# Patient Record
Sex: Female | Born: 1935
Health system: Southern US, Community
[De-identification: ages and names within clinical notes are randomized; demographics above are authoritative.]

## PROBLEM LIST (undated history)

## (undated) DIAGNOSIS — E039 Hypothyroidism, unspecified: Secondary | ICD-10-CM

## (undated) DIAGNOSIS — C801 Malignant (primary) neoplasm, unspecified: Secondary | ICD-10-CM

## (undated) DIAGNOSIS — K224 Dyskinesia of esophagus: Secondary | ICD-10-CM

## (undated) DIAGNOSIS — K921 Melena: Secondary | ICD-10-CM

## (undated) DIAGNOSIS — H539 Unspecified visual disturbance: Secondary | ICD-10-CM

## (undated) DIAGNOSIS — I10 Essential (primary) hypertension: Secondary | ICD-10-CM

## (undated) DIAGNOSIS — S52502A Unspecified fracture of the lower end of left radius, initial encounter for closed fracture: Secondary | ICD-10-CM

## (undated) DIAGNOSIS — D649 Anemia, unspecified: Secondary | ICD-10-CM

## (undated) DIAGNOSIS — I639 Cerebral infarction, unspecified: Secondary | ICD-10-CM

## (undated) DIAGNOSIS — K219 Gastro-esophageal reflux disease without esophagitis: Secondary | ICD-10-CM

## (undated) DIAGNOSIS — F419 Anxiety disorder, unspecified: Secondary | ICD-10-CM

## (undated) HISTORY — DX: Unspecified visual disturbance: H53.9

## (undated) HISTORY — DX: Cerebral infarction, unspecified: I63.9

## (undated) HISTORY — PX: OTHER SURGICAL HISTORY: SHX169

## (undated) SURGERY — EGD (ESOPHAGOGASTRODUODENOSCOPY)
Anesthesia: Moderate Sedation

---

## 2004-10-11 ENCOUNTER — Encounter: Payer: Self-pay | Admitting: Internal Medicine

## 2006-09-03 ENCOUNTER — Ambulatory Visit: Payer: Self-pay | Admitting: Internal Medicine

## 2006-09-17 ENCOUNTER — Ambulatory Visit: Payer: Self-pay | Admitting: Internal Medicine

## 2009-01-15 DIAGNOSIS — E119 Type 2 diabetes mellitus without complications: Secondary | ICD-10-CM

## 2009-01-15 DIAGNOSIS — R1310 Dysphagia, unspecified: Secondary | ICD-10-CM | POA: Insufficient documentation

## 2009-01-15 DIAGNOSIS — E039 Hypothyroidism, unspecified: Secondary | ICD-10-CM | POA: Insufficient documentation

## 2009-01-15 DIAGNOSIS — K5730 Diverticulosis of large intestine without perforation or abscess without bleeding: Secondary | ICD-10-CM | POA: Insufficient documentation

## 2009-01-15 DIAGNOSIS — F329 Major depressive disorder, single episode, unspecified: Secondary | ICD-10-CM

## 2009-01-15 DIAGNOSIS — E7800 Pure hypercholesterolemia, unspecified: Secondary | ICD-10-CM

## 2009-01-18 ENCOUNTER — Ambulatory Visit (HOSPITAL_COMMUNITY): Admission: RE | Admit: 2009-01-18 | Discharge: 2009-01-18 | Payer: Self-pay | Admitting: Ophthalmology

## 2009-01-19 ENCOUNTER — Ambulatory Visit: Payer: Self-pay | Admitting: Internal Medicine

## 2009-01-26 ENCOUNTER — Ambulatory Visit (HOSPITAL_COMMUNITY): Admission: RE | Admit: 2009-01-26 | Discharge: 2009-01-26 | Payer: Self-pay | Admitting: Internal Medicine

## 2009-01-29 ENCOUNTER — Encounter: Payer: Self-pay | Admitting: Internal Medicine

## 2009-02-12 ENCOUNTER — Ambulatory Visit: Payer: Self-pay | Admitting: Internal Medicine

## 2009-04-16 ENCOUNTER — Ambulatory Visit: Payer: Self-pay | Admitting: Internal Medicine

## 2010-04-23 NOTE — Assessment & Plan Note (Signed)
Summary: f/u...as.    History of Present Illness Visit Type: Follow-up Visit Primary GI MD: Lina Sar MD Primary Provider: Doreen Beam, MD Requesting Provider: n/a Chief Complaint: Patient denies any GI complaints at this time since she is taking her PPI once a day. She is only using the Levsin as needed.   History of Present Illness:   This is a 77 year old white female with esophageal dysmotility shown on a barium esophagram which was done in November 2010.The barium swallow showed moderate esophageal dysmotility with breakup of primary waves and intermittent tertiary contractions. She has a small hiatal hernia and mild gastroesophageal reflux. She started on a proton pump inhibitor, one a day and her symptoms have improved. She had a colonoscopy in 2008 with findings of diverticulosis. she denies having any dysphagia since her last appointment in November. Patient has continued on her Prilosec 20 mg daily. Her last episode of dysphagia occurrs when she tried to drink ice cold liquids.   GI Review of Systems      Denies abdominal pain, acid reflux, belching, bloating, chest pain, dysphagia with liquids, dysphagia with solids, heartburn, loss of appetite, nausea, vomiting, vomiting blood, weight loss, and  weight gain.        Denies anal fissure, black tarry stools, change in bowel habit, constipation, diarrhea, diverticulosis, fecal incontinence, heme positive stool, hemorrhoids, irritable bowel syndrome, jaundice, light color stool, liver problems, rectal bleeding, and  rectal pain.    Current Medications (verified): 1)  Zoloft 100 Mg Tabs (Sertraline Hcl) .... One Tablet By Mouth Once Daily 2)  Red Yeast Rice 600 Mg Tabs (Red Yeast Rice Extract) .... One Tablet By Mouth Once Daily 3)  Vitamin D (Ergocalciferol) 50000 Unit Caps (Ergocalciferol) .... Take One By Mouth Once A Week 4)  Fish Oil 1000 Mg Caps (Omega-3 Fatty Acids) .Marland Kitchen.. 1 Tablets By Mouth Once Daily 5)  Synthroid 25 Mcg Tabs  (Levothyroxine Sodium) .... One Tablet By Mouth Once Daily 6)  Cyanocobalamin 1000 Mcg/ml Soln (Cyanocobalamin) .... One Ml Once A Month 7)  Levsin/sl 0.125 Mg Subl (Hyoscyamine Sulfate) .... Dissolve 1 Tablet Under The Tongue As Needed For Esophageal Spasms 8)  Icaps  Caps (Multiple Vitamins-Minerals) .... Take One By Mouth Once Daily 9)  Omeprazole 20 Mg Cpdr (Omeprazole) .... Take One By Mouth Once Daily  Allergies (verified): 1)  ! Pcn 2)  ! Sulfa  Past History:  Past Medical History: Last updated: 01/15/2009 Current Problems:  DYSPHAGIA UNSPECIFIED (ICD-787.20) DIABETES MELLITUS, TYPE II (ICD-250.00) DEPRESSION (ICD-311) HYPOTHYROIDISM (ICD-244.9) DIVERTICULOSIS, COLON (ICD-562.10) HYPERCHOLESTEROLEMIA (ICD-272.0)    Past Surgical History: Arthroscopic surgery left eye surgery  Family History: Reviewed history from 01/19/2009 and no changes required. Family History of Diabetes: Mother Family History of Heart Disease: Mother No FH of Colon Cancer Family History of Uterine Cancer:Mother Family History of Colon Polyps:Sister  Social History: Reviewed history from 01/19/2009 and no changes required. Occupation: Retired from [employer] Alcohol Use - no Illicit Drug Use - no Patient has never smoked.   Review of Systems       The patient complains of change in vision, thirst - excessive, and urination changes/pain.  The patient denies allergy/sinus, anemia, anxiety-new, arthritis/joint pain, back pain, blood in urine, breast changes/lumps, confusion, cough, coughing up blood, depression-new, fainting, fatigue, fever, headaches-new, hearing problems, heart murmur, heart rhythm changes, itching, menstrual pain, muscle pains/cramps, night sweats, nosebleeds, pregnancy symptoms, shortness of breath, skin rash, sleeping problems, sore throat, swelling of feet/legs, swollen lymph glands, thirst -  excessive , urination - excessive , urine leakage, vision changes, and voice  change.         Pertinent positive and negative review of systems were noted in the above HPI. All other ROS was otherwise negative.   Vital Signs:  Patient profile:   77 year old female Height:      65 inches Weight:      213.0 pounds BMI:     35.57 Pulse rate:   80 / minute Pulse rhythm:   regular BP sitting:   122 / 80  (left arm) Cuff size:   regular  Vitals Entered By: Harlow Mares CMA Duncan Dull) (April 16, 2009 9:12 AM)  Physical Exam  General:  Well developed, well nourished, no acute distress. Mouth:  normal. There is no coughing or hoarseness.   Impression & Recommendations:  Problem # 1:  DYSPHAGIA UNSPECIFIED (ICD-787.20) Patient has mild esophageal dysmotility as per a barium swallow done in November 2010. She has mild gastroesophageal reflux and a small hiatal hernia demonstrated on an upper GI. She is to continue on Prilosec 20 mg daily and avoid ice cold drinks. If symptoms recur, I suggest she have an upper endoscopy.with empirical dilation  Patient Instructions: 1)  Prilosec 20 mg daily. 2)  Avoid ice cold beverages. 3)  Return p.r.n. 4)  Consider upper endoscopy if symptoms recur. 5)  Copy sent to : Dr Sherril Croon

## 2010-06-27 LAB — BASIC METABOLIC PANEL
GFR calc Af Amer: >60 mL/min (ref >60)
GFR calc non Af Amer: >60 mL/min (ref >60)
Glucose, Bld: 131 mg/dL — ABNORMAL HIGH (ref 70–99)
Potassium: 3.9 mEq/L (ref 3.5–5.1)
Sodium: 139 mEq/L (ref 135–145)

## 2010-06-27 LAB — GLUCOSE, CAPILLARY: Glucose-Capillary: 117 mg/dL — ABNORMAL HIGH (ref 70–99)

## 2010-06-27 LAB — HEMOGLOBIN AND HEMATOCRIT, BLOOD
HCT: 38.5 % (ref 36.0–46.0)
Hemoglobin: 13.2 g/dL (ref 12.0–15.0)

## 2011-10-22 ENCOUNTER — Other Ambulatory Visit: Payer: Self-pay

## 2011-10-29 ENCOUNTER — Ambulatory Visit (INDEPENDENT_AMBULATORY_CARE_PROVIDER_SITE_OTHER): Payer: Medicare Other | Admitting: Surgery

## 2011-10-29 ENCOUNTER — Encounter (INDEPENDENT_AMBULATORY_CARE_PROVIDER_SITE_OTHER): Payer: Self-pay | Admitting: Surgery

## 2011-10-29 VITALS — BP 138/62 | HR 72 | Temp 97.4°F | Resp 20 | Ht 64.0 in | Wt 210.8 lb

## 2011-10-29 DIAGNOSIS — C50912 Malignant neoplasm of unspecified site of left female breast: Secondary | ICD-10-CM | POA: Insufficient documentation

## 2011-10-29 DIAGNOSIS — C50919 Malignant neoplasm of unspecified site of unspecified female breast: Secondary | ICD-10-CM

## 2011-10-29 NOTE — Patient Instructions (Signed)
We will call you tomorrow after I have a chance to talk to radiologist and then we can make definitive plans

## 2011-10-29 NOTE — Progress Notes (Signed)
Patient ID: Crystal Rodriguez, female   DOB: 12/03/1932, 78 y.o.   MRN: [id number]  Chief Complaint  Patient presents with  . Breast Cancer    Left    HPI Crystal Rodriguez is a 78 y.o. female.  She had a recent mammogram an abnormality was found in the left breast near the lateral areolar margin. In retrospect it was noted to be palpable. A biopsy showed invasive ductal carcinoma. It is ER positive and PR negative with a low Ki-67.Also noted were some linear calcifications radiating out from the areolar area laterally that are worrisome for some DCIS. These have not been biopsied. She has not had an MRI. She is having no breast symptoms. She has a cousin who died of breast cancer that was in her 80s. She has had some prior breast biopsies for "blocked milk ducts. HPI  History reviewed. No pertinent past medical history.  Past Surgical History  Procedure Date  . Leg surgery 2001    Family History  Problem Relation Age of Onset  . Heart disease Mother   . Diabetes Mother   . Stroke Mother   . Cancer Mother     uterian    Social History History  Substance Use Topics  . Smoking status: Former Smoker  . Smokeless tobacco: Never Used  . Alcohol Use: No    Allergies  Allergen Reactions  . Penicillins   . Sulfonamide Derivatives     Current Outpatient Prescriptions  Medication Sig Dispense Refill  . atorvastatin (LIPITOR) 10 MG tablet Take 10 mg by mouth Daily.      . carvedilol (COREG) 6.25 MG tablet Take 6.25 mg by mouth Daily.      . levothyroxine (SYNTHROID, LEVOTHROID) 75 MCG tablet Take 88 mg by mouth Daily.      . sertraline (ZOLOFT) 100 MG tablet Take 100 mg by mouth Daily.      . Vitamin D, Ergocalciferol, (DRISDOL) 50000 UNITS CAPS Take 50,000 Units by mouth daily.        Review of Systems Review of Systems  Constitutional: Negative for fever, chills and unexpected weight change.  HENT: Negative for hearing loss, congestion, sore throat, trouble swallowing and  voice change.   Eyes: Negative for visual disturbance.  Respiratory: Negative for cough and wheezing.   Cardiovascular: Negative for chest pain, palpitations and leg swelling.  Gastrointestinal: Negative for nausea, vomiting, abdominal pain, diarrhea, constipation, blood in stool, abdominal distention and anal bleeding.  Genitourinary: Negative for hematuria, vaginal bleeding and difficulty urinating.  Musculoskeletal: Negative for arthralgias.  Skin: Negative for rash and wound.  Neurological: Negative for seizures, syncope and headaches.  Hematological: Negative for adenopathy. Does not bruise/bleed easily.  Psychiatric/Behavioral: Negative for confusion.    Blood pressure 138/62, pulse 72, temperature 97.4 F (36.3 C), temperature source Temporal, resp. rate 20, height 5' 4" (1.626 m), weight 210 lb 12.8 oz (95.618 kg).  Physical Exam Physical Exam  Vitals reviewed. Constitutional: She is oriented to person, place, and time. She appears well-developed and well-nourished. No distress.  HENT:  Head: Normocephalic and atraumatic.  Mouth/Throat: Oropharynx is clear and moist.  Eyes: Conjunctivae and EOM are normal. Pupils are equal, round, and reactive to light. No scleral icterus.  Neck: Normal range of motion. Neck supple. No tracheal deviation present. No thyromegaly present.  Cardiovascular: Normal rate, regular rhythm, normal heart sounds and intact distal pulses.  Exam reveals no gallop and no friction rub.   No murmur heard. Pulmonary/Chest: Effort normal and   breath sounds normal. No respiratory distress. She has no wheezes. She has no rales.         Left breast mass with question fixation to the skin at the real her age. Significant ecchymosis. The remainder of the breasts is normal as is the right breast.  Abdominal: Soft. Bowel sounds are normal. She exhibits no distension and no mass. There is no tenderness. There is no rebound and no guarding.  Musculoskeletal: Normal range  of motion. She exhibits no edema and no tenderness.       Right shoulder: She exhibits swelling.  Lymphadenopathy:    She has no cervical adenopathy.    She has no axillary adenopathy.  Neurological: She is alert and oriented to person, place, and time.  Skin: Skin is warm and dry. No rash noted. She is not diaphoretic. No erythema.  Psychiatric: She has a normal mood and affect. Her behavior is normal. Judgment and thought content normal.    Data Reviewed I have reviewed the mammogram films and reports with the radiologist and reviewed the pathology reports and slides with the pathologist  Assessment    Clinical stage I invasive ductal carcinoma left breast laterally located with calcifications Suspicious for DCIS radiating from the nipple area laterally    Plan    I have explained the pathophysiology and staging of breast cancer with particular attention to her exact situation. We discussed the multidisciplinary approach to breast cancer which often includes both medical and radiation oncology consultations.  We also discussed surgical options for the treatment of breast cancer including lumpectomy and mastectomy with possible reconstructive surgery. In addition we talked about the evaluation and management of lymph nodes including a description of sentinel lymph node biopsy and axillary dissections. We reviewed potential complications and risks including bleeding, infection, numbness,  lymphedema, and the potential need for additional surgery.  She understands that for patients who are candidate for lumpectomy or mastectomy there is an equal survival rate with either technique, but a slightly higher local recurrence rate with lumpectomy. In addition she knows that a lumpectomy usually requires postoperative radiation as part of the management of the breast cancer.  We have discussed the likely postoperative course and plans for followup.  I have given the patient some written  information that reviewed all of these issues. I believe her questions are answered and that she has a good understanding of the issues. I think she is a candidate for a partial mastectomy but this will require a central partial mastectomy included the nipple areolar complex. We can also do a sentinel lymph node evaluation at that time. I am going to review the situation with the radiologist so that we can either biopsy some of the calcifications or localized Them at the time of surgery.Once I have spoken with the radiologist we can make more definitive plans. She also does she will see medical radiation oncologist as noted above. I think all questions have been answered. I spent approximately 30 minutes after initial history physical and discussing and reviewing the plans with the patient, her daughter and her sister.       Prisila Dlouhy J 10/29/2011, 5:12 PM    

## 2011-10-31 ENCOUNTER — Telehealth (INDEPENDENT_AMBULATORY_CARE_PROVIDER_SITE_OTHER): Payer: Self-pay | Admitting: Surgery

## 2011-10-31 ENCOUNTER — Other Ambulatory Visit (INDEPENDENT_AMBULATORY_CARE_PROVIDER_SITE_OTHER): Payer: Self-pay | Admitting: Surgery

## 2011-10-31 DIAGNOSIS — C50912 Malignant neoplasm of unspecified site of left female breast: Secondary | ICD-10-CM

## 2011-10-31 NOTE — Telephone Encounter (Signed)
I spoke with the radiologist, Dr.Cornella, today about the calcifications in this patient's breast. He was able to review some prior films and these have been present, and they are linear, not branching, and he thinks there are low suspicion for malignancy. Her tumor is subareolar 3:00 position. She should be amenable to a central partial mastectomy which would likely get many of these calcifications out at the same time.  I called the patient and spoke with her about this and she would like to go ahead and schedule surgery which is what we had discussed when she was here in the office on Wednesday.

## 2011-11-07 ENCOUNTER — Encounter (HOSPITAL_BASED_OUTPATIENT_CLINIC_OR_DEPARTMENT_OTHER): Payer: Self-pay | Admitting: *Deleted

## 2011-11-07 NOTE — Progress Notes (Signed)
Coming in Monday for CMET, CBC, Diff, Cancer Antigen, EKG and CXR. Bring all medications.

## 2011-11-10 ENCOUNTER — Encounter (HOSPITAL_BASED_OUTPATIENT_CLINIC_OR_DEPARTMENT_OTHER)
Admission: RE | Admit: 2011-11-10 | Discharge: 2011-11-10 | Disposition: A | Discharge status: Home / Self Care | Payer: Medicare Other | Source: Ambulatory Visit | Attending: Surgery | Admitting: Surgery

## 2011-11-10 LAB — COMPREHENSIVE METABOLIC PANEL
ALT: 28 U/L (ref 0–35)
AST: 25 U/L (ref 0–37)
Albumin: 3.8 g/dL (ref 3.5–5.2)
CO2: 27 mEq/L (ref 19–32)
Calcium: 9.3 mg/dL (ref 8.4–10.5)
Chloride: 104 mEq/L (ref 96–112)
GFR calc non Af Amer: 89 mL/min — ABNORMAL LOW (ref >90)
Sodium: 140 mEq/L (ref 135–145)

## 2011-11-10 LAB — CBC WITH DIFFERENTIAL/PLATELET
Basophils Absolute: 0 10*3/uL (ref 0.0–0.1)
Basophils Relative: 1 % (ref 0–1)
Eosinophils Relative: 4 % (ref 0–5)
Lymphocytes Relative: 30 % (ref 12–46)
Neutro Abs: 2.7 10*3/uL (ref 1.7–7.7)
Platelets: 189 10*3/uL (ref 150–400)
RDW: 14.4 % (ref 11.5–15.5)
WBC: 4.9 10*3/uL (ref 4.0–10.5)

## 2011-11-11 LAB — CANCER ANTIGEN 27.29: CA 27.29: 13 U/mL (ref 0–39)

## 2011-11-13 ENCOUNTER — Ambulatory Visit (HOSPITAL_COMMUNITY)
Admission: RE | Admit: 2011-11-13 | Discharge: 2011-11-13 | Disposition: A | Discharge status: Home / Self Care | Payer: Medicare Other | Source: Ambulatory Visit | Attending: Surgery | Admitting: Surgery

## 2011-11-13 ENCOUNTER — Encounter (HOSPITAL_BASED_OUTPATIENT_CLINIC_OR_DEPARTMENT_OTHER): Payer: Self-pay | Admitting: *Deleted

## 2011-11-13 ENCOUNTER — Encounter (HOSPITAL_BASED_OUTPATIENT_CLINIC_OR_DEPARTMENT_OTHER)
Admission: RE | Disposition: A | Discharge status: Home / Self Care | Payer: Self-pay | Source: Ambulatory Visit | Attending: Surgery

## 2011-11-13 ENCOUNTER — Ambulatory Visit (HOSPITAL_BASED_OUTPATIENT_CLINIC_OR_DEPARTMENT_OTHER)
Admission: RE | Admit: 2011-11-13 | Discharge: 2011-11-13 | Disposition: A | Discharge status: Home / Self Care | Payer: Medicare Other | Source: Ambulatory Visit | Attending: Surgery | Admitting: Surgery

## 2011-11-13 ENCOUNTER — Encounter (HOSPITAL_BASED_OUTPATIENT_CLINIC_OR_DEPARTMENT_OTHER): Payer: Self-pay | Admitting: Anesthesiology

## 2011-11-13 ENCOUNTER — Ambulatory Visit (HOSPITAL_BASED_OUTPATIENT_CLINIC_OR_DEPARTMENT_OTHER): Payer: Medicare Other | Admitting: Anesthesiology

## 2011-11-13 DIAGNOSIS — C50912 Malignant neoplasm of unspecified site of left female breast: Secondary | ICD-10-CM

## 2011-11-13 DIAGNOSIS — C50919 Malignant neoplasm of unspecified site of unspecified female breast: Secondary | ICD-10-CM

## 2011-11-13 DIAGNOSIS — Z01810 Encounter for preprocedural cardiovascular examination: Secondary | ICD-10-CM | POA: Insufficient documentation

## 2011-11-13 DIAGNOSIS — Z01812 Encounter for preprocedural laboratory examination: Secondary | ICD-10-CM | POA: Insufficient documentation

## 2011-11-13 DIAGNOSIS — I10 Essential (primary) hypertension: Secondary | ICD-10-CM | POA: Insufficient documentation

## 2011-11-13 HISTORY — DX: Essential (primary) hypertension: I10

## 2011-11-13 HISTORY — PX: BREAST SURGERY: SHX581

## 2011-11-13 HISTORY — DX: Hypothyroidism, unspecified: E03.9

## 2011-11-13 HISTORY — DX: Dyskinesia of esophagus: K22.4

## 2011-11-13 SURGERY — PARTIAL MASTECTOMY WITH AXILLARY SENTINEL LYMPH NODE BIOPSY
Anesthesia: General | Site: Breast | Laterality: Left | Wound class: Clean

## 2011-11-13 MED ORDER — ONDANSETRON HCL 4 MG/2ML IJ SOLN
INTRAMUSCULAR | Status: DC | PRN
  Administered 2011-11-13: 4 mg via INTRAVENOUS

## 2011-11-13 MED ORDER — OXYCODONE HCL 5 MG/5ML PO SOLN: 5.0000 mg | Freq: Once | ORAL | Status: DC | PRN

## 2011-11-13 MED ORDER — HYDROMORPHONE HCL PF 1 MG/ML IJ SOLN
0.2500 mg | INTRAMUSCULAR | Status: DC | PRN
  Administered 2011-11-13 (×2): 0.5 mg via INTRAVENOUS

## 2011-11-13 MED ORDER — LIDOCAINE HCL (CARDIAC) 20 MG/ML IV SOLN
INTRAVENOUS | Status: DC | PRN
  Administered 2011-11-13: 50 mg via INTRAVENOUS

## 2011-11-13 MED ORDER — SODIUM CHLORIDE 0.9 % IJ SOLN
INTRAMUSCULAR | Status: DC | PRN
  Administered 2011-11-13: 11:00:00 via INTRAMUSCULAR

## 2011-11-13 MED ORDER — DEXAMETHASONE SODIUM PHOSPHATE 4 MG/ML IJ SOLN
INTRAMUSCULAR | Status: DC | PRN
  Administered 2011-11-13: 10 mg via INTRAVENOUS

## 2011-11-13 MED ORDER — MIDAZOLAM HCL 2 MG/2ML IJ SOLN
1.0000 mg | INTRAMUSCULAR | Status: DC | PRN
  Administered 2011-11-13: 1 mg via INTRAVENOUS

## 2011-11-13 MED ORDER — LACTATED RINGERS IV SOLN
INTRAVENOUS | Status: DC
  Administered 2011-11-13: 10:00:00 via INTRAVENOUS

## 2011-11-13 MED ORDER — DROPERIDOL 2.5 MG/ML IJ SOLN: 0.6250 mg | INTRAMUSCULAR | Status: DC | PRN

## 2011-11-13 MED ORDER — FENTANYL CITRATE 0.05 MG/ML IJ SOLN
50.0000 ug | INTRAMUSCULAR | Status: DC | PRN
  Administered 2011-11-13: 50 ug via INTRAVENOUS

## 2011-11-13 MED ORDER — TECHNETIUM TC 99M SULFUR COLLOID FILTERED
1.0000 | Freq: Once | INTRAVENOUS | Status: AC | PRN
  Administered 2011-11-13: 1 via INTRADERMAL

## 2011-11-13 MED ORDER — FENTANYL CITRATE 0.05 MG/ML IJ SOLN
INTRAMUSCULAR | Status: DC | PRN
  Administered 2011-11-13: 25 ug via INTRAVENOUS
  Administered 2011-11-13: 50 ug via INTRAVENOUS
  Administered 2011-11-13: 25 ug via INTRAVENOUS

## 2011-11-13 MED ORDER — PROPOFOL 10 MG/ML IV EMUL
INTRAVENOUS | Status: DC | PRN
  Administered 2011-11-13: 150 mg via INTRAVENOUS
  Administered 2011-11-13: 30 mg via INTRAVENOUS

## 2011-11-13 MED ORDER — CHLORHEXIDINE GLUCONATE 4 % EX LIQD: 1.0000 "application " | Freq: Once | CUTANEOUS | Status: DC

## 2011-11-13 MED ORDER — CIPROFLOXACIN IN D5W 400 MG/200ML IV SOLN
400.0000 mg | INTRAVENOUS | Status: AC
  Administered 2011-11-13: 400 mg via INTRAVENOUS

## 2011-11-13 MED ORDER — EPHEDRINE SULFATE 50 MG/ML IJ SOLN
INTRAMUSCULAR | Status: DC | PRN
  Administered 2011-11-13 (×2): 15 mg via INTRAVENOUS

## 2011-11-13 MED ORDER — BUPIVACAINE HCL (PF) 0.25 % IJ SOLN
INTRAMUSCULAR | Status: DC | PRN
  Administered 2011-11-13: 10 mL

## 2011-11-13 MED ORDER — OXYCODONE HCL 5 MG PO TABS: 5.0000 mg | ORAL_TABLET | Freq: Once | ORAL | Status: DC | PRN

## 2011-11-13 MED ORDER — HYDROCODONE-ACETAMINOPHEN 5-325 MG PO TABS: 1.0000 | ORAL_TABLET | ORAL | Status: AC | PRN

## 2011-11-13 SURGICAL SUPPLY — 65 items
ADH SKN CLS APL DERMABOND .7 (GAUZE/BANDAGES/DRESSINGS) ×2
APPLIER CLIP 11 MED OPEN (CLIP)
APPLIER CLIP 9.375 MED OPEN (MISCELLANEOUS) ×3
APR CLP MED 11 20 MLT OPN (CLIP)
APR CLP MED 9.3 20 MLT OPN (MISCELLANEOUS) ×2
BANDAGE ELASTIC 6 VELCRO ST LF (GAUZE/BANDAGES/DRESSINGS) ×1 IMPLANT
BINDER BREAST XXLRG (GAUZE/BANDAGES/DRESSINGS) ×2 IMPLANT
BIOPATCH RED 1 DISK 7.0 (GAUZE/BANDAGES/DRESSINGS) ×3 IMPLANT
BLADE HEX COATED 2.75 (ELECTRODE) ×3 IMPLANT
BLADE SURG 10 STRL SS (BLADE) ×3 IMPLANT
BLADE SURG 15 STRL LF DISP TIS (BLADE) ×2 IMPLANT
BLADE SURG 15 STRL SS (BLADE) ×3
BLADE SURG ROTATE 9660 (MISCELLANEOUS) IMPLANT
CANISTER SUCTION 1200CC (MISCELLANEOUS) ×3 IMPLANT
CHLORAPREP W/TINT 26ML (MISCELLANEOUS) ×3 IMPLANT
CLIP APPLIE 11 MED OPEN (CLIP) IMPLANT
CLIP APPLIE 9.375 MED OPEN (MISCELLANEOUS) ×1 IMPLANT
CLOTH BEACON ORANGE TIMEOUT ST (SAFETY) ×3 IMPLANT
COVER MAYO STAND STRL (DRAPES) ×3 IMPLANT
COVER PROBE W GEL 5X96 (DRAPES) ×3 IMPLANT
COVER TABLE BACK 60X90 (DRAPES) ×3 IMPLANT
DECANTER SPIKE VIAL GLASS SM (MISCELLANEOUS) IMPLANT
DERMABOND ADVANCED (GAUZE/BANDAGES/DRESSINGS) ×1
DERMABOND ADVANCED .7 DNX12 (GAUZE/BANDAGES/DRESSINGS) ×2 IMPLANT
DRAIN CHANNEL 19F RND (DRAIN) ×3 IMPLANT
DRAPE LAPAROSCOPIC ABDOMINAL (DRAPES) ×2 IMPLANT
DRAPE U-SHAPE 76X120 STRL (DRAPES) IMPLANT
DRAPE UTILITY XL STRL (DRAPES) ×3 IMPLANT
DRSG TEGADERM 2-3/8X2-3/4 SM (GAUZE/BANDAGES/DRESSINGS) ×3 IMPLANT
ELECT BLADE 4.0 EZ CLEAN MEGAD (MISCELLANEOUS) ×3
ELECT REM PT RETURN 9FT ADLT (ELECTROSURGICAL) ×3
ELECTRODE BLDE 4.0 EZ CLN MEGD (MISCELLANEOUS) ×2 IMPLANT
ELECTRODE REM PT RTRN 9FT ADLT (ELECTROSURGICAL) ×2 IMPLANT
EVACUATOR SILICONE 100CC (DRAIN) ×3 IMPLANT
GLOVE BIOGEL PI IND STRL 7.0 (GLOVE) ×1 IMPLANT
GLOVE BIOGEL PI INDICATOR 7.0 (GLOVE) ×1
GLOVE ECLIPSE 6.5 STRL STRAW (GLOVE) ×2 IMPLANT
GLOVE EUDERMIC 7 POWDERFREE (GLOVE) ×3 IMPLANT
GOWN PREVENTION PLUS XLARGE (GOWN DISPOSABLE) ×6 IMPLANT
NDL SAFETY ECLIPSE 18X1.5 (NEEDLE) ×2 IMPLANT
NEEDLE HYPO 18GX1.5 SHARP (NEEDLE) ×3
NEEDLE HYPO 25X1 1.5 SAFETY (NEEDLE) ×6 IMPLANT
NS IRRIG 1000ML POUR BTL (IV SOLUTION) ×3 IMPLANT
PACK BASIN DAY SURGERY FS (CUSTOM PROCEDURE TRAY) ×3 IMPLANT
PEN SKIN MARKING BROAD TIP (MISCELLANEOUS) ×1 IMPLANT
PENCIL BUTTON HOLSTER BLD 10FT (ELECTRODE) ×3 IMPLANT
PIN SAFETY STERILE (MISCELLANEOUS) ×3 IMPLANT
SHEET MEDIUM DRAPE 40X70 STRL (DRAPES) ×3 IMPLANT
SLEEVE SCD COMPRESS KNEE MED (MISCELLANEOUS) ×3 IMPLANT
SPONGE GAUZE 4X4 12PLY (GAUZE/BANDAGES/DRESSINGS) ×3 IMPLANT
SPONGE LAP 18X18 X RAY DECT (DISPOSABLE) ×5 IMPLANT
SPONGE LAP 4X18 X RAY DECT (DISPOSABLE) ×3 IMPLANT
STAPLER VISISTAT 35W (STAPLE) ×1 IMPLANT
SUT ETHILON 2 0 FS 18 (SUTURE) ×2 IMPLANT
SUT MNCRL AB 4-0 PS2 18 (SUTURE) ×4 IMPLANT
SUT SILK 2 0 SH (SUTURE) IMPLANT
SUT VIC AB 2-0 CT1 27 (SUTURE) ×6
SUT VIC AB 2-0 CT1 TAPERPNT 27 (SUTURE) ×4 IMPLANT
SUT VICRYL 3-0 CR8 SH (SUTURE) ×3 IMPLANT
SYR CONTROL 10ML LL (SYRINGE) ×6 IMPLANT
TOWEL OR 17X24 6PK STRL BLUE (TOWEL DISPOSABLE) ×4 IMPLANT
TOWEL OR NON WOVEN STRL DISP B (DISPOSABLE) ×3 IMPLANT
TUBE CONNECTING 20X1/4 (TUBING) ×3 IMPLANT
WATER STERILE IRR 1000ML POUR (IV SOLUTION) ×1 IMPLANT
YANKAUER SUCT BULB TIP NO VENT (SUCTIONS) ×3 IMPLANT

## 2011-11-13 NOTE — Progress Notes (Signed)
Monitors and oxygen on throughout injection procedure and post procedure.  Family to bedside prior to injection.   Rails up, bed low.  Monitors and oxygen remain on.  VS  WDL

## 2011-11-13 NOTE — Anesthesia Preprocedure Evaluation (Addendum)
Anesthesia Evaluation  Patient identified by MRN, date of birth, ID band Patient awake    Reviewed: Allergy & Precautions, H&P , NPO status , Patient's Chart, lab work & pertinent test results  Airway Mallampati: II TM Distance: >3 FB Neck ROM: full    Dental  (+) Teeth Intact and Dental Advidsory Given   Pulmonary neg pulmonary ROS,  breath sounds clear to auscultation  Pulmonary exam normal       Cardiovascular hypertension, Rhythm:regular Rate:Normal  On BP meds for 2 days, but pt stopped due to swelling in legs and feet.  Has not returned to her MD since stopping.   Neuro/Psych PSYCHIATRIC DISORDERS negative neurological ROS     GI/Hepatic negative GI ROS, Neg liver ROS,   Endo/Other  No Meds for DM  Renal/GU      Musculoskeletal   Abdominal Normal abdominal exam  (+)   Peds  Hematology negative hematology ROS (+)   Anesthesia Other Findings   Reproductive/Obstetrics                          Anesthesia Physical Anesthesia Plan  ASA: III  Anesthesia Plan: General LMA   Post-op Pain Management:    Induction:   Airway Management Planned:   Additional Equipment:   Intra-op Plan:   Post-operative Plan:   Informed Consent: I have reviewed the patients History and Physical, chart, labs and discussed the procedure including the risks, benefits and alternatives for the proposed anesthesia with the patient or authorized representative who has indicated his/her understanding and acceptance.   Dental Advisory Given  Plan Discussed with: Anesthesiologist, CRNA and Surgeon  Anesthesia Plan Comments:         Anesthesia Quick Evaluation

## 2011-11-13 NOTE — H&P (View-Only) (Signed)
Patient ID: Crystal Rodriguez, female   DOB: [birth year], 78 y.o.   MRN: [id number]  Chief Complaint  Patient presents with  . Breast Cancer    Left    HPI Crystal Rodriguez is a 78 y.o. female.  She had a recent mammogram an abnormality was found in the left breast near the lateral areolar margin. In retrospect it was noted to be palpable. A biopsy showed invasive ductal carcinoma. It is ER positive and PR negative with a low Ki-67.Also noted were some linear calcifications radiating out from the areolar area laterally that are worrisome for some DCIS. These have not been biopsied. She has not had an MRI. She is having no breast symptoms. She has a cousin who died of breast cancer that was in her 40s. She has had some prior breast biopsies for "blocked milk ducts. HPI  History reviewed. No pertinent past medical history.  Past Surgical History  Procedure Date  . Leg surgery 2001    Family History  Problem Relation Age of Onset  . Heart disease Mother   . Diabetes Mother   . Stroke Mother   . Cancer Mother     Raj Janus    Social History History  Substance Use Topics  . Smoking status: Former Games developer  . Smokeless tobacco: Never Used  . Alcohol Use: No    Allergies  Allergen Reactions  . Penicillins   . Sulfonamide Derivatives     Current Outpatient Prescriptions  Medication Sig Dispense Refill  . atorvastatin (LIPITOR) 10 MG tablet Take 10 mg by mouth Daily.      . carvedilol (COREG) 6.25 MG tablet Take 6.25 mg by mouth Daily.      Marland Kitchen levothyroxine (SYNTHROID, LEVOTHROID) 75 MCG tablet Take 88 mg by mouth Daily.      . sertraline (ZOLOFT) 100 MG tablet Take 100 mg by mouth Daily.      . Vitamin D, Ergocalciferol, (DRISDOL) 50000 UNITS CAPS Take 50,000 Units by mouth daily.        Review of Systems Review of Systems  Constitutional: Negative for fever, chills and unexpected weight change.  HENT: Negative for hearing loss, congestion, sore throat, trouble swallowing and  voice change.   Eyes: Negative for visual disturbance.  Respiratory: Negative for cough and wheezing.   Cardiovascular: Negative for chest pain, palpitations and leg swelling.  Gastrointestinal: Negative for nausea, vomiting, abdominal pain, diarrhea, constipation, blood in stool, abdominal distention and anal bleeding.  Genitourinary: Negative for hematuria, vaginal bleeding and difficulty urinating.  Musculoskeletal: Negative for arthralgias.  Skin: Negative for rash and wound.  Neurological: Negative for seizures, syncope and headaches.  Hematological: Negative for adenopathy. Does not bruise/bleed easily.  Psychiatric/Behavioral: Negative for confusion.    Blood pressure 138/62, pulse 72, temperature 97.4 F (36.3 C), temperature source Temporal, resp. rate 20, height 5\' 4"  (1.626 m), weight 210 lb 12.8 oz (95.618 kg).  Physical Exam Physical Exam  Vitals reviewed. Constitutional: She is oriented to person, place, and time. She appears well-developed and well-nourished. No distress.  HENT:  Head: Normocephalic and atraumatic.  Mouth/Throat: Oropharynx is clear and moist.  Eyes: Conjunctivae and EOM are normal. Pupils are equal, round, and reactive to light. No scleral icterus.  Neck: Normal range of motion. Neck supple. No tracheal deviation present. No thyromegaly present.  Cardiovascular: Normal rate, regular rhythm, normal heart sounds and intact distal pulses.  Exam reveals no gallop and no friction rub.   No murmur heard. Pulmonary/Chest: Effort normal and  breath sounds normal. No respiratory distress. She has no wheezes. She has no rales.         Left breast mass with question fixation to the skin at the real her age. Significant ecchymosis. The remainder of the breasts is normal as is the right breast.  Abdominal: Soft. Bowel sounds are normal. She exhibits no distension and no mass. There is no tenderness. There is no rebound and no guarding.  Musculoskeletal: Normal range  of motion. She exhibits no edema and no tenderness.       Right shoulder: She exhibits swelling.  Lymphadenopathy:    She has no cervical adenopathy.    She has no axillary adenopathy.  Neurological: She is alert and oriented to person, place, and time.  Skin: Skin is warm and dry. No rash noted. She is not diaphoretic. No erythema.  Psychiatric: She has a normal mood and affect. Her behavior is normal. Judgment and thought content normal.    Data Reviewed I have reviewed the mammogram films and reports with the radiologist and reviewed the pathology reports and slides with the pathologist  Assessment    Clinical stage I invasive ductal carcinoma left breast laterally located with calcifications Suspicious for DCIS radiating from the nipple area laterally    Plan    I have explained the pathophysiology and staging of breast cancer with particular attention to her exact situation. We discussed the multidisciplinary approach to breast cancer which often includes both medical and radiation oncology consultations.  We also discussed surgical options for the treatment of breast cancer including lumpectomy and mastectomy with possible reconstructive surgery. In addition we talked about the evaluation and management of lymph nodes including a description of sentinel lymph node biopsy and axillary dissections. We reviewed potential complications and risks including bleeding, infection, numbness,  lymphedema, and the potential need for additional surgery.  She understands that for patients who are candidate for lumpectomy or mastectomy there is an equal survival rate with either technique, but a slightly higher local recurrence rate with lumpectomy. In addition she knows that a lumpectomy usually requires postoperative radiation as part of the management of the breast cancer.  We have discussed the likely postoperative course and plans for followup.  I have given the patient some written  information that reviewed all of these issues. I believe her questions are answered and that she has a good understanding of the issues. I think she is a candidate for a partial mastectomy but this will require a central partial mastectomy included the nipple areolar complex. We can also do a sentinel lymph node evaluation at that time. I am going to review the situation with the radiologist so that we can either biopsy some of the calcifications or localized Them at the time of surgery.Once I have spoken with the radiologist we can make more definitive plans. She also does she will see medical radiation oncologist as noted above. I think all questions have been answered. I spent approximately 30 minutes after initial history physical and discussing and reviewing the plans with the patient, her daughter and her sister.       Aeneas Longsworth J 10/29/2011, 5:12 PM

## 2011-11-13 NOTE — Interval H&P Note (Signed)
History and Physical Interval Note:  11/13/2011 10:17 AM  Crystal Rodriguez  has presented today for surgery, with the diagnosis of left breast cancer  The various methods of treatment have been discussed with the patient and family. After consideration of risks, benefits and other options for treatment, the patient has consented to  Procedure(s) (LRB): MASTECTOMY WITH SENTINEL LYMPH NODE BIOPSY (Left) as a surgical intervention .  The patient's history has been reviewed, patient examined, no change in status, stable for surgery.  I have reviewed the patient's chart and labs.  Questions were answered to the patient's satisfaction.   Left breast is marked as the operative side  [name] J

## 2011-11-13 NOTE — Anesthesia Procedure Notes (Signed)
Procedure Name: LMA Insertion Performed by: Rockie Vawter W Pre-anesthesia Checklist: Patient identified, Timeout performed, Emergency Drugs available, Suction available and Patient being monitored Patient Re-evaluated:Patient Re-evaluated prior to inductionOxygen Delivery Method: Circle system utilized Preoxygenation: Pre-oxygenation with 100% oxygen Intubation Type: IV induction Ventilation: Mask ventilation without difficulty LMA: LMA with gastric port inserted LMA Size: 4.0 Number of attempts: 1 Tube secured with: Tape Dental Injury: Teeth and Oropharynx as per pre-operative assessment      

## 2011-11-13 NOTE — Anesthesia Postprocedure Evaluation (Signed)
Anesthesia Post Note  Patient: Crystal Rodriguez  Procedure(s) Performed: Procedure(s) (LRB): PARTIAL MASTECTOMY WITH AXILLARY SENTINEL LYMPH NODE BIOPSY (Left)  Anesthesia type: general  Patient location: PACU  Post pain: Pain level controlled  Post assessment: Patient's Cardiovascular Status Stable  Last Vitals:  Filed Vitals:   11/13/11 1315  BP: 152/65  Pulse: 66  Temp:   Resp: 10    Post vital signs: Reviewed and stable  Level of consciousness: sedated  Complications: No apparent anesthesia complications

## 2011-11-13 NOTE — Transfer of Care (Signed)
Immediate Anesthesia Transfer of Care Note  Patient: Crystal Rodriguez  Procedure(s) Performed: Procedure(s) (LRB): PARTIAL MASTECTOMY WITH AXILLARY SENTINEL LYMPH NODE BIOPSY (Left)  Patient Location: PACU  Anesthesia Type: General  Level of Consciousness: awake and alert   Airway & Oxygen Therapy: Patient Spontanous Breathing and Patient connected to face mask oxygen  Post-op Assessment: Report given to PACU RN and Post -op Vital signs reviewed and stable  Post vital signs: Reviewed and stable  Complications: No apparent anesthesia complications

## 2011-11-13 NOTE — Op Note (Signed)
Crystal Rodriguez Jan 05, 1933 [id number] 10/31/2011  Preoperative diagnosis: Carcinoma, left breast, invasive ductal, central, clinical stage I  Postoperative diagnosis: Same  Procedure: Left partial mastectomy with Blue dye injection and axillary sentinel lymph node biopsy  Surgeon: Currie Paris, MD, FACS   Anesthesia: General   Clinical History and Indications: This patient was recently found to have a left breast cancer subareolar lateral aspect of the areolar. There some calcifications also present these are thought to be benign. After discussion the patient she'll proceed with partial mastectomy with excision of the nipple areola complex and sentinel lymph node evaluation    Description of Procedure: I saw the patient  Area and further plans left breast was marked as the operative site.  The patient was taken to the operating room And after satisfactory general anesthesia was obtained a timeout was done. The left breast was injected with 5 cc of dilute methylene blue subareolar and this was massaged in.A full prep and drape was then done.  I outlined an incision was elliptical to include the nipple areolar complex. The mass was palpable at the areolar edge. Using the cautery it took a wide excision around the tumor and the breast to be sure at margins. I went down almost to the chest wall. By palpation was well around the tumor in all directions with what appear to be mainly normal fatty tissue at all the margins. However seemed to be close to deepest margin So I took additional deep margin separate.  I made sure it was dry. I put clips to mark all of the margins. I closed In several layers with 2-0 Vicryl followed by 3-0 Vicryl and 4-0 Monocryl subcuticular.  Using neoprobe identified a hot area in the axilla and a transverse axillary incision. I deepened the incision with cautery and entered the axilla. I found a blue lymphatic leading to a blue lymph node which was hot. This was  excised had counts of around 800. Initial hot lymph node is found adjacent and removed. It did not have blue dye. No other palpably abnormal lymph nodes. There were no hot areas and no other blue areas noted.  I then injected about 10 cc of 0.25% plain Marcaine to help with postop anesthesia. I closed in layers with 3-0 Vicryl, 4-0 Monocryl subcuticular, and Dermabond.  The patient tolerated the procedure well. There no operative complications. All counts are correct.      Currie Paris, MD, FACS 11/13/2011 11:36 AM

## 2011-11-14 ENCOUNTER — Telehealth (INDEPENDENT_AMBULATORY_CARE_PROVIDER_SITE_OTHER): Payer: Self-pay | Admitting: General Surgery

## 2011-11-14 NOTE — Telephone Encounter (Signed)
Message copied by Littie Deeds on Fri Nov 14, 2011  3:36 PM ------      Message from: Currie Paris      Created: Fri Nov 14, 2011  3:17 PM       Tell the patient that her margins are OK and her lymph nodes are negative. I will discuss in detail in the office.

## 2011-11-14 NOTE — Telephone Encounter (Signed)
Called pt to let her know her margins are okay and that her lymph nodes were negative.  Also made her a first PO appt.

## 2011-11-17 ENCOUNTER — Telehealth (INDEPENDENT_AMBULATORY_CARE_PROVIDER_SITE_OTHER): Payer: Self-pay | Admitting: General Surgery

## 2011-11-17 NOTE — Telephone Encounter (Signed)
Called pt and re-informed her that her margins were negative and her lymph nodes look okay.

## 2011-11-17 NOTE — Telephone Encounter (Signed)
Message copied by Littie Deeds on Mon Nov 17, 2011 12:14 PM ------      Message from: [name]      Created: Mon Nov 17, 2011 11:44 AM      Regarding: Crystal Rodriguez: [phone]       This patient stated that she was called with results. I see that Penni Bombard called her last week. However, patient wasn't sure about the conversation or if we were the office that called because she was groggy and mentioned something about results. Please call to discuss. Thank you.

## 2011-11-19 ENCOUNTER — Telehealth (INDEPENDENT_AMBULATORY_CARE_PROVIDER_SITE_OTHER): Payer: Self-pay | Admitting: General Surgery

## 2011-11-19 NOTE — Telephone Encounter (Signed)
Message copied by Liliana Cline on Wed Nov 19, 2011  3:12 PM ------      Message from: Marin Shutter      Created: Wed Nov 19, 2011  2:16 PM      Regarding: Dr. Jamey Ripa      Contact: [phone]       Would like to know results of sx, and if Dr. Jamey Ripa will know if she needs radiation or not.

## 2011-11-19 NOTE — Telephone Encounter (Signed)
Spoke with patient. She states she does not remember the first time she got her pathology results and she is still confused from the second time she received them. I assured her that her pathology was good and she will receive a copy at her post operative appt. I let her know that receiving radiation is the standard treatment for women who undergo lumpectomy. She will call with any additional questions.

## 2011-12-03 ENCOUNTER — Encounter (INDEPENDENT_AMBULATORY_CARE_PROVIDER_SITE_OTHER): Payer: Self-pay | Admitting: Surgery

## 2011-12-03 ENCOUNTER — Ambulatory Visit (INDEPENDENT_AMBULATORY_CARE_PROVIDER_SITE_OTHER): Payer: Medicare Other | Admitting: Surgery

## 2011-12-03 VITALS — BP 154/50 | HR 70 | Temp 97.3°F | Ht 64.0 in | Wt 207.8 lb

## 2011-12-03 DIAGNOSIS — C50912 Malignant neoplasm of unspecified site of left female breast: Secondary | ICD-10-CM

## 2011-12-03 DIAGNOSIS — Z09 Encounter for follow-up examination after completed treatment for conditions other than malignant neoplasm: Secondary | ICD-10-CM

## 2011-12-03 DIAGNOSIS — C50919 Malignant neoplasm of unspecified site of unspecified female breast: Secondary | ICD-10-CM

## 2011-12-03 NOTE — Progress Notes (Signed)
NAME: Crystal Rodriguez                                            DOB: [birth year] DATE: 12/03/2011                                                  MRN: [id number]  CC: Post op   HPI: This patient comes in for post op follow-up.Sheunderwent left central partial mastectomy and SLN on 11/13/11. She feels that she is doing well.  PE:  VITAL SIGNS: BP 154/50  Pulse 70  Temp 97.3 F (36.3 C) (Temporal)  Ht 5\' 4"  (1.626 m)  Wt 207 lb 12.8 oz (94.257 kg)  BMI 35.67 kg/m2  SpO2 95%  General: The patient appears to be healthy Breast: Incision healing nicely with no infection or hematoma  DATA REVIEWED: Path noted T1a, N0 mucinous IDC  IMPRESSION: The patient is doing well S/P lumpectomy.    PLAN: Will see in three months. Gave her a copy of path and reviewed it with her. Will arrange med and rad onc visits in [place].

## 2011-12-03 NOTE — Patient Instructions (Addendum)
We will arrange consultations with medical and radiation oncologists in [place]. Let us know if you have not heard of an appointment time by Monday

## 2011-12-03 NOTE — Addendum Note (Signed)
Addended byLiliana Cline on: 12/03/2011 04:43 PM   Modules accepted: Orders

## 2011-12-04 ENCOUNTER — Telehealth (INDEPENDENT_AMBULATORY_CARE_PROVIDER_SITE_OTHER): Payer: Self-pay | Admitting: General Surgery

## 2011-12-04 NOTE — Telephone Encounter (Signed)
Appt scheduled with Dr Ubaldo Glassing on 12/18/11 at 11:30 am. He will schedule patient with Radiation oncology after he sees her. Patient made aware of appt. Will call with any problems.

## 2011-12-08 ENCOUNTER — Encounter (INDEPENDENT_AMBULATORY_CARE_PROVIDER_SITE_OTHER): Payer: Self-pay

## 2011-12-18 ENCOUNTER — Encounter: Payer: Medicare Other | Admitting: Internal Medicine

## 2011-12-18 DIAGNOSIS — M949 Disorder of cartilage, unspecified: Secondary | ICD-10-CM

## 2011-12-18 DIAGNOSIS — M899 Disorder of bone, unspecified: Secondary | ICD-10-CM

## 2011-12-18 DIAGNOSIS — C50919 Malignant neoplasm of unspecified site of unspecified female breast: Secondary | ICD-10-CM

## 2012-01-19 ENCOUNTER — Telehealth (INDEPENDENT_AMBULATORY_CARE_PROVIDER_SITE_OTHER): Payer: Self-pay | Admitting: General Surgery

## 2012-01-19 NOTE — Telephone Encounter (Signed)
Message copied by Liliana Cline on Mon Jan 19, 2012  3:53 PM ------      Message from: Erin Sons      Created: Mon Jan 19, 2012  3:29 PM      Regarding: Dr Jamey Ripa      Contact: [phone]       Pt has question about estrogen blocker. Pt want to know if Dr Jamey Ripa was going to write prescription or her Radiologist. Please call pt concerning this matter

## 2012-01-19 NOTE — Telephone Encounter (Signed)
Patient made aware this is usually prescribed by her medical oncologist. She will set up an appt to speak with Dr Ubaldo Glassing.

## 2012-02-09 ENCOUNTER — Encounter: Payer: Medicare Other | Admitting: Internal Medicine

## 2012-02-09 DIAGNOSIS — C50919 Malignant neoplasm of unspecified site of unspecified female breast: Secondary | ICD-10-CM

## 2012-03-09 ENCOUNTER — Ambulatory Visit (INDEPENDENT_AMBULATORY_CARE_PROVIDER_SITE_OTHER): Payer: Medicare Other | Admitting: Surgery

## 2012-03-09 ENCOUNTER — Encounter (INDEPENDENT_AMBULATORY_CARE_PROVIDER_SITE_OTHER): Payer: Self-pay | Admitting: Surgery

## 2012-03-09 VITALS — BP 144/62 | HR 88 | Temp 97.2°F | Resp 20 | Ht 64.0 in | Wt 217.2 lb

## 2012-03-09 DIAGNOSIS — Z853 Personal history of malignant neoplasm of breast: Secondary | ICD-10-CM

## 2012-03-09 NOTE — Progress Notes (Signed)
NAME: Crystal Rodriguez       DOB: 1932-04-29           DATE: 03/09/2012       MRN: [id number]   [name] is a 78 y.o.Marland Kitchenfemale who presents for routine followup of her Stage I receptor positive,invasive ductal carcinoma of the left breast diagnosed in July, 2013 and treated with central partial mastectomy,sentinel node, and no other therapy.. She has no problems or concerns on either side.  PFSH: She has had no significant changes since the last visit here.She did have a small squamous cancer removed from her back by a dermatologist  ROS: There have been no significant changes since the last visit here  EXAM:  VS: BP 144/62  Pulse 88  Temp 97.2 F (36.2 C) (Temporal)  Resp 20  Ht 5\' 4"  (1.626 m)  Wt 217 lb 3.2 oz (98.521 kg)  BMI 37.28 kg/m2  General: The patient is alert, oriented, generally healthy appearing, NAD. Mood and affect are normal.  Breasts:  Right breast large and pendulous, no palpable mass. Left s/p central partial mastectomy. There is still some erythema of the cnentral part of the breast, but no mass, tenderness or any suggestion of a recurrence  Lymphatics: She has no axillary or supraclavicular adenopathy on either side.  Extremities: Full ROM of the surgical side with no lymphedema noted.  Data Reviewed: No new  Impression: Doing well, with no evidence of recurrent cancer or new cancer  Plan: Will see back in six months.

## 2012-03-09 NOTE — Patient Instructions (Signed)
See me again in July, after you have had your mammogram done.

## 2012-06-09 DIAGNOSIS — C50919 Malignant neoplasm of unspecified site of unspecified female breast: Secondary | ICD-10-CM

## 2012-06-09 DIAGNOSIS — N63 Unspecified lump in unspecified breast: Secondary | ICD-10-CM

## 2012-07-27 ENCOUNTER — Telehealth (INDEPENDENT_AMBULATORY_CARE_PROVIDER_SITE_OTHER): Payer: Self-pay

## 2012-07-27 NOTE — Telephone Encounter (Signed)
The pt called to clarify the timing of her follow up and mammogram.  She is scheduled for [facility] right now on June 12th and to see Dr Jamey Ripa on June 17th.  Please call.

## 2012-07-27 NOTE — Telephone Encounter (Signed)
Called patient and made her aware per Dr Tenna Child last office note he wanted to see her in July 2014. Appt moved.

## 2012-09-07 ENCOUNTER — Ambulatory Visit (INDEPENDENT_AMBULATORY_CARE_PROVIDER_SITE_OTHER): Payer: Medicare Other | Admitting: Surgery

## 2012-10-08 ENCOUNTER — Ambulatory Visit (INDEPENDENT_AMBULATORY_CARE_PROVIDER_SITE_OTHER): Payer: Medicare Other | Admitting: Surgery

## 2012-10-08 VITALS — BP 136/78 | HR 72 | Resp 16 | Ht 65.0 in | Wt 209.0 lb

## 2012-10-08 DIAGNOSIS — Z853 Personal history of malignant neoplasm of breast: Secondary | ICD-10-CM

## 2012-10-08 NOTE — Progress Notes (Signed)
NAME: KISHANA BATTEY Ivery       DOB: Aug 29, 1932           DATE: 10/08/2012       MRN: [id number]   COURNEY GARROD is a 79 y.o.Marland Kitchenfemale who presents for routine followup of her Stage I receptor positive,invasive ductal carcinoma of the left breast diagnosed in July, 2013 and treated with central partial mastectomy,sentinel node, and no other therapy.. She has no problems or concerns on either side.  PFSH: She has had no significant changes since the last visit here. ROS: There have been no significant changes since the last visit here  EXAM:  VS: BP 136/78  Pulse 72  Resp 16  Ht 5\' 5"  (1.651 m)  Wt 209 lb (94.802 kg)  BMI 34.78 kg/m2  General: The patient is alert, oriented, generally healthy appearing, NAD. Mood and affect are normal.  Breasts:  Right breast large and pendulous, no palpable mass. Left s/p central partial mastectomy. There is no mass, tenderness or any suggestion of a recurrence  Lymphatics: She has no axillary or supraclavicular adenopathy on either side.  Extremities: Full ROM of the surgical side with no lymphedema noted.  Data Reviewed: Mammogram pending  Impression: Doing well, with no evidence of recurrent cancer or new cancer  Plan: Will see back in six months.

## 2012-10-08 NOTE — Patient Instructions (Signed)
Continue annual follow ups for a few more years

## 2013-09-20 ENCOUNTER — Encounter (INDEPENDENT_AMBULATORY_CARE_PROVIDER_SITE_OTHER): Payer: Self-pay

## 2013-10-24 ENCOUNTER — Encounter (INDEPENDENT_AMBULATORY_CARE_PROVIDER_SITE_OTHER): Payer: Self-pay | Admitting: Surgery

## 2013-10-24 ENCOUNTER — Ambulatory Visit (INDEPENDENT_AMBULATORY_CARE_PROVIDER_SITE_OTHER): Payer: Medicare Other | Admitting: Surgery

## 2013-10-24 VITALS — BP 136/80 | HR 82 | Resp 14 | Ht 64.0 in | Wt 218.4 lb

## 2013-10-24 DIAGNOSIS — Z853 Personal history of malignant neoplasm of breast: Secondary | ICD-10-CM

## 2013-10-24 NOTE — Progress Notes (Signed)
NAME: Crystal Rodriguez       DOB: 09/26/1932           DATE: 10/24/2013       MRN: [id number]   Crystal Rodriguez is a 80 y.o.Marland Kitchenfemale who presents for routine followup of her Stage I receptor positive,invasive ductal carcinoma of the left breast diagnosed in July, 2013 and treated with central partial mastectomy,sentinel node, and no other therapy.. She has no problems or concerns on either side.  PFSH: She has had no significant changes since the last visit here. ROS: There have been no significant changes since the last visit here  EXAM:  VS: BP 136/80  Pulse 82  Resp 14  Ht 5\' 4"  (1.626 m)  Wt 218 lb 6.4 oz (99.066 kg)  BMI 37.47 kg/m2  General: The patient is alert, oriented, generally healthy appearing, NAD. Mood and affect are normal.  Breasts:  Right breast large and pendulous, no palpable mass. Left s/p central partial mastectomy. There is no mass, tenderness or any suggestion of a recurrence  Lymphatics: She has no axillary or supraclavicular adenopathy on either side.  Extremities: Full ROM of the surgical side with no lymphedema noted.  Data Reviewed: Mammogram BIRADS 1   Impression: Doing well, with no evidence of recurrent cancer or new cancer  Plan: Will see back in 12 months.

## 2013-10-24 NOTE — Patient Instructions (Signed)
Return 1 year. 

## 2014-06-07 ENCOUNTER — Other Ambulatory Visit: Payer: Self-pay

## 2014-06-07 ENCOUNTER — Encounter: Payer: Self-pay | Admitting: *Deleted

## 2014-06-07 MED ORDER — HYOSCYAMINE SULFATE 0.125 MG SL SUBL: 0.1250 mg | SUBLINGUAL_TABLET | SUBLINGUAL | Status: DC | PRN

## 2015-02-27 DIAGNOSIS — I69398 Other sequelae of cerebral infarction: Secondary | ICD-10-CM | POA: Diagnosis not present

## 2015-02-27 DIAGNOSIS — Z7982 Long term (current) use of aspirin: Secondary | ICD-10-CM | POA: Diagnosis not present

## 2015-02-27 DIAGNOSIS — I69334 Monoplegia of upper limb following cerebral infarction affecting left non-dominant side: Secondary | ICD-10-CM | POA: Diagnosis not present

## 2015-02-27 DIAGNOSIS — Z853 Personal history of malignant neoplasm of breast: Secondary | ICD-10-CM | POA: Diagnosis not present

## 2015-02-27 DIAGNOSIS — E039 Hypothyroidism, unspecified: Secondary | ICD-10-CM | POA: Diagnosis not present

## 2015-02-27 DIAGNOSIS — I10 Essential (primary) hypertension: Secondary | ICD-10-CM | POA: Diagnosis not present

## 2015-02-27 DIAGNOSIS — E7800 Pure hypercholesterolemia, unspecified: Secondary | ICD-10-CM | POA: Diagnosis not present

## 2015-02-27 DIAGNOSIS — Z7902 Long term (current) use of antithrombotics/antiplatelets: Secondary | ICD-10-CM | POA: Diagnosis not present

## 2015-02-27 DIAGNOSIS — H538 Other visual disturbances: Secondary | ICD-10-CM | POA: Diagnosis not present

## 2015-03-02 DIAGNOSIS — H538 Other visual disturbances: Secondary | ICD-10-CM | POA: Diagnosis not present

## 2015-03-02 DIAGNOSIS — E039 Hypothyroidism, unspecified: Secondary | ICD-10-CM | POA: Diagnosis not present

## 2015-03-02 DIAGNOSIS — I69398 Other sequelae of cerebral infarction: Secondary | ICD-10-CM | POA: Diagnosis not present

## 2015-03-02 DIAGNOSIS — I69334 Monoplegia of upper limb following cerebral infarction affecting left non-dominant side: Secondary | ICD-10-CM | POA: Diagnosis not present

## 2015-03-02 DIAGNOSIS — E7800 Pure hypercholesterolemia, unspecified: Secondary | ICD-10-CM | POA: Diagnosis not present

## 2015-03-02 DIAGNOSIS — I10 Essential (primary) hypertension: Secondary | ICD-10-CM | POA: Diagnosis not present

## 2015-03-05 DIAGNOSIS — I69334 Monoplegia of upper limb following cerebral infarction affecting left non-dominant side: Secondary | ICD-10-CM | POA: Diagnosis not present

## 2015-03-05 DIAGNOSIS — E039 Hypothyroidism, unspecified: Secondary | ICD-10-CM | POA: Diagnosis not present

## 2015-03-05 DIAGNOSIS — I69398 Other sequelae of cerebral infarction: Secondary | ICD-10-CM | POA: Diagnosis not present

## 2015-03-05 DIAGNOSIS — H538 Other visual disturbances: Secondary | ICD-10-CM | POA: Diagnosis not present

## 2015-03-05 DIAGNOSIS — E7800 Pure hypercholesterolemia, unspecified: Secondary | ICD-10-CM | POA: Diagnosis not present

## 2015-03-05 DIAGNOSIS — I10 Essential (primary) hypertension: Secondary | ICD-10-CM | POA: Diagnosis not present

## 2015-03-15 DIAGNOSIS — E7800 Pure hypercholesterolemia, unspecified: Secondary | ICD-10-CM | POA: Diagnosis not present

## 2015-03-15 DIAGNOSIS — I10 Essential (primary) hypertension: Secondary | ICD-10-CM | POA: Diagnosis not present

## 2015-03-15 DIAGNOSIS — I69334 Monoplegia of upper limb following cerebral infarction affecting left non-dominant side: Secondary | ICD-10-CM | POA: Diagnosis not present

## 2015-03-15 DIAGNOSIS — H538 Other visual disturbances: Secondary | ICD-10-CM | POA: Diagnosis not present

## 2015-03-15 DIAGNOSIS — E039 Hypothyroidism, unspecified: Secondary | ICD-10-CM | POA: Diagnosis not present

## 2015-03-15 DIAGNOSIS — I69398 Other sequelae of cerebral infarction: Secondary | ICD-10-CM | POA: Diagnosis not present

## 2015-03-27 DIAGNOSIS — E2839 Other primary ovarian failure: Secondary | ICD-10-CM | POA: Diagnosis not present

## 2015-04-10 DIAGNOSIS — I69398 Other sequelae of cerebral infarction: Secondary | ICD-10-CM | POA: Diagnosis not present

## 2015-04-10 DIAGNOSIS — E7800 Pure hypercholesterolemia, unspecified: Secondary | ICD-10-CM | POA: Diagnosis not present

## 2015-04-10 DIAGNOSIS — H538 Other visual disturbances: Secondary | ICD-10-CM | POA: Diagnosis not present

## 2015-04-10 DIAGNOSIS — E039 Hypothyroidism, unspecified: Secondary | ICD-10-CM | POA: Diagnosis not present

## 2015-04-10 DIAGNOSIS — I69334 Monoplegia of upper limb following cerebral infarction affecting left non-dominant side: Secondary | ICD-10-CM | POA: Diagnosis not present

## 2015-04-10 DIAGNOSIS — I10 Essential (primary) hypertension: Secondary | ICD-10-CM | POA: Diagnosis not present

## 2015-04-12 ENCOUNTER — Encounter: Payer: Self-pay | Admitting: Internal Medicine

## 2015-04-16 DIAGNOSIS — Z8673 Personal history of transient ischemic attack (TIA), and cerebral infarction without residual deficits: Secondary | ICD-10-CM | POA: Diagnosis not present

## 2015-04-16 DIAGNOSIS — C50112 Malignant neoplasm of central portion of left female breast: Secondary | ICD-10-CM | POA: Diagnosis not present

## 2015-04-27 DIAGNOSIS — I639 Cerebral infarction, unspecified: Secondary | ICD-10-CM | POA: Diagnosis not present

## 2015-04-27 DIAGNOSIS — M792 Neuralgia and neuritis, unspecified: Secondary | ICD-10-CM | POA: Diagnosis not present

## 2015-04-27 DIAGNOSIS — E119 Type 2 diabetes mellitus without complications: Secondary | ICD-10-CM | POA: Diagnosis not present

## 2015-04-27 DIAGNOSIS — Z6833 Body mass index (BMI) 33.0-33.9, adult: Secondary | ICD-10-CM | POA: Diagnosis not present

## 2015-04-27 DIAGNOSIS — I10 Essential (primary) hypertension: Secondary | ICD-10-CM | POA: Diagnosis not present

## 2015-04-27 DIAGNOSIS — H6982 Other specified disorders of Eustachian tube, left ear: Secondary | ICD-10-CM | POA: Diagnosis not present

## 2015-05-03 DIAGNOSIS — M7989 Other specified soft tissue disorders: Secondary | ICD-10-CM | POA: Diagnosis not present

## 2015-05-03 DIAGNOSIS — M792 Neuralgia and neuritis, unspecified: Secondary | ICD-10-CM | POA: Diagnosis not present

## 2015-05-16 DIAGNOSIS — Z789 Other specified health status: Secondary | ICD-10-CM | POA: Diagnosis not present

## 2015-05-16 DIAGNOSIS — I10 Essential (primary) hypertension: Secondary | ICD-10-CM | POA: Diagnosis not present

## 2015-05-16 DIAGNOSIS — I639 Cerebral infarction, unspecified: Secondary | ICD-10-CM | POA: Diagnosis not present

## 2015-05-16 DIAGNOSIS — E1165 Type 2 diabetes mellitus with hyperglycemia: Secondary | ICD-10-CM | POA: Diagnosis not present

## 2015-05-21 ENCOUNTER — Encounter: Payer: Self-pay | Admitting: Neurology

## 2015-05-21 ENCOUNTER — Ambulatory Visit (INDEPENDENT_AMBULATORY_CARE_PROVIDER_SITE_OTHER): Payer: Medicare Other | Admitting: Neurology

## 2015-05-21 DIAGNOSIS — I63131 Cerebral infarction due to embolism of right carotid artery: Secondary | ICD-10-CM | POA: Diagnosis not present

## 2015-05-21 DIAGNOSIS — I639 Cerebral infarction, unspecified: Secondary | ICD-10-CM | POA: Insufficient documentation

## 2015-05-21 NOTE — Patient Instructions (Addendum)
I had a long d/w patient and daughter about her recent stroke, risk for recurrent stroke/TIAs, personally independently reviewed imaging studies and stroke evaluation results and answered questions.Continue Plavix  for secondary stroke prevention and maintain strict control of hypertension with blood pressure goal below 130/90, diabetes with hemoglobin A1c goal below 6.5% and lipids with LDL cholesterol goal below 70 mg/dL. I also advised the patient to eat a healthy diet with plenty of whole grains, cereals, fruits and vegetables, exercise regularly and maintain ideal body weight . Check transesophageal echocardiogram as well as 30 day Holter monitor for atrial fibrillation. Check CT angiogram of the brain and neck for intracranial stenosis as well as evaluate retrograde right vertebral artery flow. The patient may also consider possible participation in the [place] trial if interested. She was given written information to review at home. Followup in the future with me in 3 months or call earlier if necessary. Stroke Prevention Some medical conditions and behaviors are associated with an increased chance of having a stroke. You may prevent a stroke by making healthy choices and managing medical conditions. HOW CAN I REDUCE MY RISK OF HAVING A STROKE?   Stay physically active. Get at least 30 minutes of activity on most or all days.  Do not smoke. It may also be helpful to avoid exposure to secondhand smoke.  Limit alcohol use. Moderate alcohol use is considered to be:  No more than 2 drinks per day for men.  No more than 1 drink per day for nonpregnant women.  Eat healthy foods. This involves:  Eating 5 or more servings of fruits and vegetables a day.  Making dietary changes that address high blood pressure (hypertension), high cholesterol, diabetes, or obesity.  Manage your cholesterol levels.  Making food choices that are high in fiber and low in saturated fat, trans fat, and  cholesterol may control cholesterol levels.  Take any prescribed medicines to control cholesterol as directed by your health care provider.  Manage your diabetes.  Controlling your carbohydrate and sugar intake is recommended to manage diabetes.  Take any prescribed medicines to control diabetes as directed by your health care provider.  Control your hypertension.  Making food choices that are low in salt (sodium), saturated fat, trans fat, and cholesterol is recommended to manage hypertension.  Ask your health care provider if you need treatment to lower your blood pressure. Take any prescribed medicines to control hypertension as directed by your health care provider.  If you are 74-79 years of age, have your blood pressure checked every 3-5 years. If you are 55 years of age or older, have your blood pressure checked every year.  Maintain a healthy weight.  Reducing calorie intake and making food choices that are low in sodium, saturated fat, trans fat, and cholesterol are recommended to manage weight.  Stop drug abuse.  Avoid taking birth control pills.  Talk to your health care provider about the risks of taking birth control pills if you are over 81 years old, smoke, get migraines, or have ever had a blood clot.  Get evaluated for sleep disorders (sleep apnea).  Talk to your health care provider about getting a sleep evaluation if you snore a lot or have excessive sleepiness.  Take medicines only as directed by your health care provider.  For some people, aspirin or blood thinners (anticoagulants) are helpful in reducing the risk of forming abnormal blood clots that can lead to stroke. If you have the irregular heart rhythm of  atrial fibrillation, you should be on a blood thinner unless there is a good reason you cannot take them.  Understand all your medicine instructions.  Make sure that other conditions (such as anemia or atherosclerosis) are addressed. SEEK IMMEDIATE  MEDICAL CARE IF:   You have sudden weakness or numbness of the face, arm, or leg, especially on one side of the body.  Your face or eyelid droops to one side.  You have sudden confusion.  You have trouble speaking (aphasia) or understanding.  You have sudden trouble seeing in one or both eyes.  You have sudden trouble walking.  You have dizziness.  You have a loss of balance or coordination.  You have a sudden, severe headache with no known cause.  You have new chest pain or an irregular heartbeat. Any of these symptoms may represent a serious problem that is an emergency. Do not wait to see if the symptoms will go away. Get medical help at once. Call your local emergency services (911 in [place] Do not drive yourself to the hospital.   This information is not intended to replace advice given to you by your health care provider. Make sure you discuss any questions you have with your health care provider.   Document Released: 04/17/2004 Document Revised: 03/31/2014 Document Reviewed: 09/10/2012 Elsevier Interactive Patient Education [employer].

## 2015-05-21 NOTE — Progress Notes (Signed)
[facility] [address] [place]. [place] [address] [phone]       OFFICE CONSULT NOTE  Crystal Rodriguez Date of Birth:  1932/04/08 Medical Record Number:  [id number]   Referring MD:  Jerene Bears  Reason for Referral:  Stroke HPI: Crystal Rodriguez is an 82 year pleasant Caucasian lady who developed sudden onset of loss of vision in the left eye as well as left upper extremity numbness and weakness on 02/22/15. As the symptoms did not improve she went to the hospital and was admitted for evaluation. Initial CT scan of the head was unremarkable. I personally reviewed MRI scans echocardiogram Doppler studies and lab work done at [facility]. MRI scan of the brain on 02/23/2015 showed tiny scattered right middle cerebral artery multiple punctate infarcts. MRA of the brain was not done. Carotid ultrasound showed bilateral extracranial plaques but without significant stenosis. Transthoracic echo showed normal ejection fraction without cardiac source of embolism. Patient was started on Plavix for stroke prevention. I was unable to find report of lipid profile but patient is on Lipitor 20 mg a day. She states she noticed improvement in her vision in a couple of days back to normal. Left arm numbness also has improved and she has no residual deficits. She was seen by ophthalmologist Dr. Zadie Rhine recently who found a normal eye exam. The patient wonders if she had mini strokes in the past and states that about 80 years ago she had some transient occasional drooling from the right coronary the mouth but at the time she saw her primary physician and no testing was ordered. The patient is at present on Plavix which is tolerating well with only occasional bruising but no bleeding. She is also on Lipitor 20 mg she is tolerating well. She takes Lyrica at night for sciatica. ROS:   14 system review of systems is positive for fever, chills, fatigue, leg swelling, hearing loss, spinning sensation,  urination problems, skin moles, blurred and double vision, anemia, bruising, bleeding, feeling hot and cold, joint pain and swelling, memory loss, weakness, slurred speech, dizziness, anxiety, depression, tumors sleep, not enough sleep, disinterest in activities  PMH:  Past Medical History  Diagnosis Date  . Hypertension     just started taking BP meds  . Hypothyroidism   . Esophageal spasm     2011- Dr Delfin Edis    Social History:  Social History   Social History  . Marital Status: Widowed    Spouse Name: N/A  . Number of Children: N/A  . Years of Education: N/A   Occupational History  . Not on file.   Social History Main Topics  . Smoking status: Never Smoker   . Smokeless tobacco: Never Used  . Alcohol Use: No  . Drug Use: No  . Sexual Activity: Not on file   Other Topics Concern  . Not on file   Social History Narrative    Medications:   Current Outpatient Prescriptions on File Prior to Visit  Medication Sig Dispense Refill  . levothyroxine (SYNTHROID, LEVOTHROID) 75 MCG tablet Take 88 mg by mouth Daily.    . sertraline (ZOLOFT) 100 MG tablet Take 100 mg by mouth.      No current facility-administered medications on file prior to visit.    Allergies:   Allergies  Allergen Reactions  . Bee Venom Swelling  . Penicillins Swelling    Pt had swelling and hives  . Sulfonamide Derivatives     Physical Exam General:  well developed, well nourished elderly Caucasian lady, seated, in no evident distress Head: head normocephalic and atraumatic.   Neck: supple with no carotid or supraclavicular bruits Cardiovascular: regular rate and rhythm, no murmurs Musculoskeletal: no deformity Skin:  no rash/petichiae Vascular:  Normal pulses all extremities  Neurologic Exam Mental Status: Awake and fully alert. Oriented to place and time. Recent and remote memory intact. Attention span, concentration and fund of knowledge appropriate. Mood and affect appropriate.    Cranial Nerves: Fundoscopic exam reveals sharp disc margins. Pupils unequal, right larger than left and postsurgical briskly reactive to light. Extraocular movements full without nystagmus. Visual fields full to confrontation. Hearing intact. Facial sensation intact. Face, tongue, palate moves normally and symmetrically.  Motor: Normal bulk and tone. Normal strength in all tested extremity muscles. Sensory.: intact to touch , pinprick , position and vibratory sensation.  Coordination: Rapid alternating movements normal in all extremities. Finger-to-nose and heel-to-shin performed accurately bilaterally. Gait and Station: Arises from chair without difficulty. Stance is normal. Gait demonstrates normal stride length and balance . Able to heel, toe and tandem walk with slight difficulty.  Reflexes: 1+ and symmetric. Toes downgoing.   NIHSS  0 Modified Rankin  1   ASSESSMENT: 82 year old Caucasian lady with embolic right MCA infarcts and transient left eye vision loss in December 2016 of undetermined source. Likely cryptogenic    PLAN: I had a long d/w patient and daughter about her recent stroke, risk for recurrent stroke/TIAs, personally independently reviewed imaging studies and stroke evaluation results and answered questions.Continue Plavix  for secondary stroke prevention and maintain strict control of hypertension with blood pressure goal below 130/90, diabetes with hemoglobin A1c goal below 6.5% and lipids with LDL cholesterol goal below 70 mg/dL. I also advised the patient to eat a healthy diet with plenty of whole grains, cereals, fruits and vegetables, exercise regularly and maintain ideal body weight . Check transesophageal echocardiogram as well as 30 day Holter monitor for atrial fibrillation. Check CT angiogram of the brain and neck for intracranial stenosis as well as evaluate retrograde right vertebral artery flow. The patient may also consider possible participation in the [place] trial if interested. She was given written information to review at home. Greater than 50% time during this 40 minute consultation visit was spent on counseling and coordination of care about her stroke Followup in the future with me in 3 months or call earlier if necessary.  Antony Contras, M.D. Note: This document was prepared with digital dictation and possible smart phrase technology. Any transcriptional errors that result from this process are unintentional.

## 2015-05-22 ENCOUNTER — Telehealth: Payer: Self-pay | Admitting: Neurology

## 2015-05-22 ENCOUNTER — Other Ambulatory Visit: Payer: Self-pay | Admitting: Neurology

## 2015-05-22 ENCOUNTER — Other Ambulatory Visit (HOSPITAL_COMMUNITY): Payer: Self-pay

## 2015-05-22 DIAGNOSIS — I63131 Cerebral infarction due to embolism of right carotid artery: Secondary | ICD-10-CM

## 2015-05-22 DIAGNOSIS — I63139 Cerebral infarction due to embolism of unspecified carotid artery: Secondary | ICD-10-CM

## 2015-05-22 NOTE — Telephone Encounter (Signed)
Patient called and states she forgot to tell the doctor she has a hiatal hernia and is unsure how that will affect the tests Dr. Leonie Man mentioned as far as putting something down her throat. Please call patient.  Thanks!

## 2015-05-22 NOTE — Telephone Encounter (Signed)
Rn call patient back about a test that would go down her throat. Rn stated she has orders for a CT angio.head and neck.. Rn explain the test requires contrast dye, with a IV. Pt verbalized understanding.

## 2015-05-23 ENCOUNTER — Telehealth: Payer: Self-pay | Admitting: Neurology

## 2015-05-23 ENCOUNTER — Other Ambulatory Visit: Payer: Self-pay

## 2015-05-23 NOTE — Telephone Encounter (Signed)
I have talked to patient's daughter she is aware of all apt.'s for her mother . TEE CT's  Angio  Cardiac monitor. Daughter understood all details.

## 2015-05-23 NOTE — Telephone Encounter (Signed)
When I spoke to patient  She is aware RN for Crystal Rodriguez will be calling her for Instructions for TEE.

## 2015-05-23 NOTE — Telephone Encounter (Signed)
Called patient's daughter Crystal Rodriguez [phone] Per patient please relay apt for I called and left her a message. When Daughter calls please let me speak with her.  TEE CT's  Cardiac monitor .

## 2015-05-23 NOTE — Addendum Note (Signed)
Addended by: Marval Regal on: 05/23/2015 09:45 AM   Modules accepted: Orders

## 2015-05-23 NOTE — Telephone Encounter (Signed)
Patient is scheduled for TEE this coming Monday 05/28/2015. Arrive at 9:00 am at short stay Dr. Oval Linsey will be doing TEE. TEE will be done at [facility]. TEE telephone number [phone] Case number for TEE [id number]. When I spoke to patient she relayed she wanted to Have TEE at [facility] and have her 30 day cardiac event monitor at [facility] in [place] [phone].   Cardiac event Monitor Lea buer [place]  [phone] . Scheduled for March 7th at 1:00 pm .  Patient is aware when her TEE is spoke with   her . Patient relayed to me to call her back after to lunch to let her no when her monitor is. Patient understood all details.

## 2015-05-25 ENCOUNTER — Telehealth: Payer: Self-pay | Admitting: Cardiovascular Disease

## 2015-05-25 NOTE — Telephone Encounter (Signed)
New message       Pt is due to have a TEE by Dr Oval Linsey on 05-28-15 ordered by Dr Leonie Man.  She want to make sure that Dr Oval Linsey knows that she has a hernia and will she still be able to do the test.  Dr Clydene Fake office told pt to call our office

## 2015-05-25 NOTE — Telephone Encounter (Signed)
Spoke to patient  She states she has hiatal hernia for 40 some years  Informed her that will not interfere with TEE procedure on 05/28/15,per Dr Oval Linsey  PATIENT VERBALIZED UNDERSTANDING.

## 2015-05-28 ENCOUNTER — Encounter (HOSPITAL_COMMUNITY)
Admission: RE | Disposition: A | Discharge status: Home / Self Care | Payer: Self-pay | Source: Ambulatory Visit | Attending: Cardiovascular Disease

## 2015-05-28 ENCOUNTER — Encounter (HOSPITAL_COMMUNITY): Payer: Self-pay | Admitting: Cardiovascular Disease

## 2015-05-28 ENCOUNTER — Ambulatory Visit (HOSPITAL_COMMUNITY)
Admission: RE | Admit: 2015-05-28 | Discharge: 2015-05-28 | Disposition: A | Discharge status: Home / Self Care | Payer: Medicare Other | Source: Ambulatory Visit | Attending: Cardiovascular Disease | Admitting: Cardiovascular Disease

## 2015-05-28 ENCOUNTER — Ambulatory Visit (HOSPITAL_BASED_OUTPATIENT_CLINIC_OR_DEPARTMENT_OTHER)
Admission: RE | Admit: 2015-05-28 | Discharge: 2015-05-28 | Disposition: A | Discharge status: Home / Self Care | Payer: Medicare Other | Source: Ambulatory Visit | Attending: Cardiovascular Disease | Admitting: Cardiovascular Disease

## 2015-05-28 DIAGNOSIS — I10 Essential (primary) hypertension: Secondary | ICD-10-CM | POA: Insufficient documentation

## 2015-05-28 DIAGNOSIS — I6930 Unspecified sequelae of cerebral infarction: Secondary | ICD-10-CM | POA: Insufficient documentation

## 2015-05-28 DIAGNOSIS — Q211 Atrial septal defect: Secondary | ICD-10-CM | POA: Diagnosis not present

## 2015-05-28 DIAGNOSIS — Z7982 Long term (current) use of aspirin: Secondary | ICD-10-CM | POA: Insufficient documentation

## 2015-05-28 DIAGNOSIS — I639 Cerebral infarction, unspecified: Secondary | ICD-10-CM | POA: Diagnosis not present

## 2015-05-28 DIAGNOSIS — E039 Hypothyroidism, unspecified: Secondary | ICD-10-CM | POA: Insufficient documentation

## 2015-05-28 DIAGNOSIS — Z79899 Other long term (current) drug therapy: Secondary | ICD-10-CM | POA: Insufficient documentation

## 2015-05-28 DIAGNOSIS — Z8673 Personal history of transient ischemic attack (TIA), and cerebral infarction without residual deficits: Secondary | ICD-10-CM | POA: Insufficient documentation

## 2015-05-28 DIAGNOSIS — Z7902 Long term (current) use of antithrombotics/antiplatelets: Secondary | ICD-10-CM | POA: Diagnosis not present

## 2015-05-28 HISTORY — PX: TEE WITHOUT CARDIOVERSION: SHX5443

## 2015-05-28 SURGERY — ECHOCARDIOGRAM, TRANSESOPHAGEAL
Anesthesia: Moderate Sedation

## 2015-05-28 MED ORDER — SODIUM CHLORIDE 0.9 % IV SOLN: INTRAVENOUS | Status: DC

## 2015-05-28 MED ORDER — FENTANYL CITRATE (PF) 100 MCG/2ML IJ SOLN
INTRAMUSCULAR | Status: AC
  Filled 2015-05-28: qty 2

## 2015-05-28 MED ORDER — BUTAMBEN-TETRACAINE-BENZOCAINE 2-2-14 % EX AERO
INHALATION_SPRAY | CUTANEOUS | Status: DC | PRN
  Administered 2015-05-28: 2 via TOPICAL

## 2015-05-28 MED ORDER — MIDAZOLAM HCL 10 MG/2ML IJ SOLN
INTRAMUSCULAR | Status: DC | PRN
  Administered 2015-05-28: 2 mg via INTRAVENOUS

## 2015-05-28 MED ORDER — FENTANYL CITRATE (PF) 100 MCG/2ML IJ SOLN
INTRAMUSCULAR | Status: DC | PRN
  Administered 2015-05-28: 25 ug via INTRAVENOUS

## 2015-05-28 MED ORDER — MIDAZOLAM HCL 5 MG/ML IJ SOLN
INTRAMUSCULAR | Status: AC
Start: 2015-05-28 — End: 2015-05-28
  Filled 2015-05-28: qty 2

## 2015-05-28 NOTE — CV Procedure (Signed)
Brief TEE Note  LVEF >55% Trivial MR and TR.  Mild PR. No LA or LAA thrombus or mass. +PFO with right to left flow at rest.   Moderate Sedation: Fentanyl 25 mcg, Versed 2 mg Sedation time: 10 minutes  The patient tolerated the procedure well and there were no complications.  For additional details see full report.  Crystal Ewalt C. Oval Linsey, MD, [facility]  05/28/2015  10:17 AM

## 2015-05-28 NOTE — H&P (Signed)
    Patient ID: Crystal Rodriguez MRN: [id number] DOB/AGE: 80-Jun-1934 80 y.o.  Admit date: (Not on file) Primary Physician  Glenda Chroman., MD   Chief Complaint    stroke   HPI: Crystal Rodriguez is an 82F with hypertension hiatal hernia, and hypothyroidism who presents for TEE for evaluation of stroke.  Crystal Rodriguez had a stroke with L upper extremity weakness and vision loss on 02/22/15.  MRI showed a stroke in the R MCA territory.  Carotid ultrasound showed bilateral, non-obstructive plaque.  Her symptoms resolved and she was started on Plavix and atorvastatin.  Echo reportedly showed normal systolic function and no obvious source of embolism.  She saw Dr. Antony Contras on 2/27 and was referred for TEE.     Past Medical History  Diagnosis Date  . Hypertension     just started taking BP meds  . Hypothyroidism   . Esophageal spasm     2011- Dr Delfin Edis    No prescriptions prior to admission     Allergies  Allergen Reactions  . Bee Venom Swelling  . Penicillins Swelling    Has patient had a PCN reaction causing immediate rash, facial/tongue/throat swelling, SOB or lightheadedness with hypotension: No Has patient had a PCN reaction causing severe rash involving mucus membranes or skin necrosis: No Has patient had a PCN reaction that required hospitalization No Has patient had a PCN reaction occurring within the last 10 years: No If all of the above answers are "NO", then may proceed with Cephalosporin use.  . Sulfonamide Derivatives     Social History   Social History  . Marital Status: Widowed    Spouse Name: N/A  . Number of Children: N/A  . Years of Education: N/A   Occupational History  . Not on file.   Social History Main Topics  . Smoking status: Never Smoker   . Smokeless tobacco: Never Used  . Alcohol Use: No  . Drug Use: No  . Sexual Activity: Not on file   Other Topics Concern  . Not on file   Social History Narrative    Family History  Problem  Relation Age of Onset  . Heart disease Mother   . Diabetes Mother   . Cancer Mother     Theadora Rama    PHYSICAL EXAM: There were no vitals filed for this visit. General:  Well appearing. No respiratory difficulty HEENT: normal Neck: supple. no JVD. Carotids 2+ bilat; no bruits. No lymphadenopathy or thryomegaly appreciated. Cor: PMI nondisplaced. Regular rate & rhythm. No rubs, gallops or murmurs. Lungs: clear Abdomen: soft, nontender, nondistended. No hepatosplenomegaly. No bruits or masses. Good bowel sounds. Extremities: no cyanosis, clubbing, rash, edema Neuro: alert & oriented x 3, cranial nerves grossly intact. moves all 4 extremities w/o difficulty. Affect pleasant.   No results found for this or any previous visit (from the past 24 hour(s)). No results found.    ASSESSMENT/PLAN: 82F with hypertension and hypothyroidism who presents for TEE for evaluation of stroke.  Signed: Gabbie Marzo C. Oval Linsey, MD, [facility]  05/28/2015, 8:07 AM

## 2015-05-28 NOTE — Progress Notes (Signed)
  Echocardiogram Echocardiogram Transesophageal has been performed.  Crystal Rodriguez 05/28/2015, 10:35 AM

## 2015-05-29 ENCOUNTER — Ambulatory Visit (INDEPENDENT_AMBULATORY_CARE_PROVIDER_SITE_OTHER): Payer: Medicare Other

## 2015-05-29 ENCOUNTER — Other Ambulatory Visit: Payer: Medicare Other

## 2015-05-29 DIAGNOSIS — I63131 Cerebral infarction due to embolism of right carotid artery: Secondary | ICD-10-CM

## 2015-05-29 DIAGNOSIS — I4891 Unspecified atrial fibrillation: Secondary | ICD-10-CM | POA: Diagnosis not present

## 2015-05-31 ENCOUNTER — Ambulatory Visit
Admission: RE | Admit: 2015-05-31 | Discharge: 2015-05-31 | Disposition: A | Discharge status: Home / Self Care | Payer: Medicare Other | Source: Ambulatory Visit | Attending: Neurology | Admitting: Neurology

## 2015-05-31 ENCOUNTER — Other Ambulatory Visit: Payer: Medicare Other

## 2015-05-31 DIAGNOSIS — I63131 Cerebral infarction due to embolism of right carotid artery: Secondary | ICD-10-CM

## 2015-05-31 DIAGNOSIS — I6523 Occlusion and stenosis of bilateral carotid arteries: Secondary | ICD-10-CM | POA: Diagnosis not present

## 2015-05-31 MED ORDER — IOPAMIDOL (ISOVUE-370) INJECTION 76%
100.0000 mL | Freq: Once | INTRAVENOUS | Status: AC | PRN
  Administered 2015-05-31: 100 mL via INTRAVENOUS

## 2015-06-29 DIAGNOSIS — R21 Rash and other nonspecific skin eruption: Secondary | ICD-10-CM | POA: Diagnosis not present

## 2015-06-29 DIAGNOSIS — E119 Type 2 diabetes mellitus without complications: Secondary | ICD-10-CM | POA: Diagnosis not present

## 2015-06-29 DIAGNOSIS — I639 Cerebral infarction, unspecified: Secondary | ICD-10-CM | POA: Diagnosis not present

## 2015-06-29 DIAGNOSIS — I10 Essential (primary) hypertension: Secondary | ICD-10-CM | POA: Diagnosis not present

## 2015-07-04 DIAGNOSIS — H43811 Vitreous degeneration, right eye: Secondary | ICD-10-CM | POA: Diagnosis not present

## 2015-07-04 DIAGNOSIS — G453 Amaurosis fugax: Secondary | ICD-10-CM | POA: Diagnosis not present

## 2015-07-04 DIAGNOSIS — H532 Diplopia: Secondary | ICD-10-CM | POA: Diagnosis not present

## 2015-07-04 DIAGNOSIS — H35371 Puckering of macula, right eye: Secondary | ICD-10-CM | POA: Diagnosis not present

## 2015-07-19 DIAGNOSIS — E1165 Type 2 diabetes mellitus with hyperglycemia: Secondary | ICD-10-CM | POA: Diagnosis not present

## 2015-07-19 DIAGNOSIS — I10 Essential (primary) hypertension: Secondary | ICD-10-CM | POA: Diagnosis not present

## 2015-07-19 DIAGNOSIS — R600 Localized edema: Secondary | ICD-10-CM | POA: Diagnosis not present

## 2015-07-24 ENCOUNTER — Telehealth: Payer: Self-pay | Admitting: Neurology

## 2015-07-24 DIAGNOSIS — E7800 Pure hypercholesterolemia, unspecified: Secondary | ICD-10-CM | POA: Diagnosis not present

## 2015-07-24 DIAGNOSIS — E119 Type 2 diabetes mellitus without complications: Secondary | ICD-10-CM | POA: Diagnosis not present

## 2015-07-24 DIAGNOSIS — I639 Cerebral infarction, unspecified: Secondary | ICD-10-CM | POA: Diagnosis not present

## 2015-07-24 NOTE — Telephone Encounter (Signed)
Rn call patient back about her appt on May 10. Pt wanted to know why she was coming back to see Dr.Sethi. Rn stated that she had test that were order by Dr.Sethi and he is going to go over the test and do a plan for her care. Pt stated she also had CD from [facility] and gave it to [facility]. Pt stated she never got back the films and CD. Pt would like a copy of the CD back. Pt will be at the appt on 08/01/2015.

## 2015-07-24 NOTE — Telephone Encounter (Signed)
Pt wants a call back to discuss her appt.

## 2015-07-24 NOTE — Telephone Encounter (Signed)
Pt returned Katrina's call. °

## 2015-07-24 NOTE — Telephone Encounter (Signed)
Rn call patient unable to leave vm. Phone kept ringing no vm was set up on home phone.

## 2015-08-01 ENCOUNTER — Ambulatory Visit: Payer: Medicare Other | Admitting: Neurology

## 2015-08-09 ENCOUNTER — Telehealth: Payer: Self-pay | Admitting: *Deleted

## 2015-08-09 NOTE — Telephone Encounter (Signed)
------------------------------------------------------------   Crystal Rodriguez        CID [id number]  Patient SAME                 Pt's Dr Leonie Man        Area Code 336 Phone# [phone] 5/23 APPT @ 315, WANTS TO KNOW IF THERE     IS AN EARLIER TIME THAT DAY, OR A 5/22 APPT AVAIL    Disp:Y/N N If Y = C/B If No Response In 57minutes ============================================================

## 2015-08-14 ENCOUNTER — Ambulatory Visit: Payer: Medicare Other | Admitting: Neurology

## 2015-08-21 DIAGNOSIS — I639 Cerebral infarction, unspecified: Secondary | ICD-10-CM | POA: Diagnosis not present

## 2015-08-21 DIAGNOSIS — I10 Essential (primary) hypertension: Secondary | ICD-10-CM | POA: Diagnosis not present

## 2015-08-21 DIAGNOSIS — Z418 Encounter for other procedures for purposes other than remedying health state: Secondary | ICD-10-CM | POA: Diagnosis not present

## 2015-08-21 DIAGNOSIS — E1165 Type 2 diabetes mellitus with hyperglycemia: Secondary | ICD-10-CM | POA: Diagnosis not present

## 2015-08-23 DIAGNOSIS — E7800 Pure hypercholesterolemia, unspecified: Secondary | ICD-10-CM | POA: Diagnosis not present

## 2015-08-23 DIAGNOSIS — I639 Cerebral infarction, unspecified: Secondary | ICD-10-CM | POA: Diagnosis not present

## 2015-08-23 DIAGNOSIS — E119 Type 2 diabetes mellitus without complications: Secondary | ICD-10-CM | POA: Diagnosis not present

## 2015-08-28 DIAGNOSIS — Z01818 Encounter for other preprocedural examination: Secondary | ICD-10-CM | POA: Diagnosis not present

## 2015-08-28 DIAGNOSIS — Z299 Encounter for prophylactic measures, unspecified: Secondary | ICD-10-CM | POA: Diagnosis not present

## 2015-08-28 DIAGNOSIS — T148 Other injury of unspecified body region: Secondary | ICD-10-CM | POA: Diagnosis not present

## 2015-08-28 DIAGNOSIS — S8992XA Unspecified injury of left lower leg, initial encounter: Secondary | ICD-10-CM | POA: Diagnosis not present

## 2015-09-20 DIAGNOSIS — Z1239 Encounter for other screening for malignant neoplasm of breast: Secondary | ICD-10-CM | POA: Diagnosis not present

## 2015-09-20 DIAGNOSIS — Z853 Personal history of malignant neoplasm of breast: Secondary | ICD-10-CM | POA: Diagnosis not present

## 2015-10-16 DIAGNOSIS — Z299 Encounter for prophylactic measures, unspecified: Secondary | ICD-10-CM | POA: Diagnosis not present

## 2015-10-16 DIAGNOSIS — R600 Localized edema: Secondary | ICD-10-CM | POA: Diagnosis not present

## 2015-10-22 ENCOUNTER — Ambulatory Visit (INDEPENDENT_AMBULATORY_CARE_PROVIDER_SITE_OTHER): Payer: Medicare Other | Admitting: Neurology

## 2015-10-22 ENCOUNTER — Encounter: Payer: Self-pay | Admitting: Neurology

## 2015-10-22 VITALS — BP 135/80 | HR 59 | Ht 64.0 in | Wt 205.8 lb

## 2015-10-22 DIAGNOSIS — I639 Cerebral infarction, unspecified: Secondary | ICD-10-CM

## 2015-10-22 DIAGNOSIS — G3184 Mild cognitive impairment, so stated: Secondary | ICD-10-CM | POA: Diagnosis not present
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
## 2015-10-22 NOTE — Patient Instructions (Signed)
I had a long d/w patient and daughter about her recent stroke, risk for recurrent stroke/TIAs, personally independently reviewed imaging studies and stroke evaluation results and answered questions.Continue Plavix   for secondary stroke prevention and maintain strict control of hypertension with blood pressure goal below 130/90, diabetes with hemoglobin A1c goal below 6.5% and lipids with LDL cholesterol goal below 70 mg/dL. I recommend loop recorder insertion for paroxysmal atrial fibrillation and patient is willing. Plan to refer the patient to cardiac electrophysiology for the same I also advised the patient to  participate in mentally challenging activities like solving crossword puzzles, playing sudoku to help with the mild cognitive impairment. I also discussed with her memory compensation strategies as well as fall and safety precautions. Followup in the future with stroke nurse practitioner in 6 months or call earlier if necessary. Memory Compensation Strategies  1. Use "WARM" strategy.  W= write it down  A= associate it  R= repeat it  M= make a mental note  2.   You can keep a Social worker.  Use a 3-ring notebook with sections for the following: calendar, important names and phone numbers,  medications, doctors' names/phone numbers, lists/reminders, and a section to journal what you did  each day.   3.    Use a calendar to write appointments down.  4.    Write yourself a schedule for the day.  This can be placed on the calendar or in a separate section of the Memory Notebook.  Keeping a  regular schedule can help memory.  5.    Use medication organizer with sections for each day or morning/evening pills.  You may need help loading it  6.    Keep a basket, or pegboard by the door.  Place items that you need to take out with you in the basket or on the pegboard.  You may also want to  include a message board for reminders.  7.    Use sticky notes.  Place sticky notes with reminders  in a place where the task is performed.  For example: " turn off the  stove" placed by the stove, "lock the door" placed on the door at eye level, " take your medications" on  the bathroom mirror or by the place where you normally take your medications.  8.    Use alarms/timers.  Use while cooking to remind yourself to check on food or as a reminder to take your medicine, or as a  reminder to make a call, or as a reminder to perform another task, etc.  Fall Prevention in the Home  Falls can cause injuries. They can happen to people of all ages. There are many things you can do to make your home safe and to help prevent falls.  WHAT CAN I DO ON THE OUTSIDE OF MY HOME?  Regularly fix the edges of walkways and driveways and fix any cracks.  Remove anything that might make you trip as you walk through a door, such as a raised step or threshold.  Trim any bushes or trees on the path to your home.  Use bright outdoor lighting.  Clear any walking paths of anything that might make someone trip, such as rocks or tools.  Regularly check to see if handrails are loose or broken. Make sure that both sides of any steps have handrails.  Any raised decks and porches should have guardrails on the edges.  Have any leaves, snow, or ice cleared regularly.  Use sand  or salt on walking paths during winter.  Clean up any spills in your garage right away. This includes oil or grease spills. WHAT CAN I DO IN THE BATHROOM?   Use night lights.  Install grab bars by the toilet and in the tub and shower. Do not use towel bars as grab bars.  Use non-skid mats or decals in the tub or shower.  If you need to sit down in the shower, use a plastic, non-slip stool.  Keep the floor dry. Clean up any water that spills on the floor as soon as it happens.  Remove soap buildup in the tub or shower regularly.  Attach bath mats securely with double-sided non-slip rug tape.  Do not have throw rugs and other things  on the floor that can make you trip. WHAT CAN I DO IN THE BEDROOM?  Use night lights.  Make sure that you have a light by your bed that is easy to reach.  Do not use any sheets or blankets that are too big for your bed. They should not hang down onto the floor.  Have a firm chair that has side arms. You can use this for support while you get dressed.  Do not have throw rugs and other things on the floor that can make you trip. WHAT CAN I DO IN THE KITCHEN?  Clean up any spills right away.  Avoid walking on wet floors.  Keep items that you use a lot in easy-to-reach places.  If you need to reach something above you, use a strong step stool that has a grab bar.  Keep electrical cords out of the way.  Do not use floor polish or wax that makes floors slippery. If you must use wax, use non-skid floor wax.  Do not have throw rugs and other things on the floor that can make you trip. WHAT CAN I DO WITH MY STAIRS?  Do not leave any items on the stairs.  Make sure that there are handrails on both sides of the stairs and use them. Fix handrails that are broken or loose. Make sure that handrails are as long as the stairways.  Check any carpeting to make sure that it is firmly attached to the stairs. Fix any carpet that is loose or worn.  Avoid having throw rugs at the top or bottom of the stairs. If you do have throw rugs, attach them to the floor with carpet tape.  Make sure that you have a light switch at the top of the stairs and the bottom of the stairs. If you do not have them, ask someone to add them for you. WHAT ELSE CAN I DO TO HELP PREVENT FALLS?  Wear shoes that:  Do not have high heels.  Have rubber bottoms.  Are comfortable and fit you well.  Are closed at the toe. Do not wear sandals.  If you use a stepladder:  Make sure that it is fully opened. Do not climb a closed stepladder.  Make sure that both sides of the stepladder are locked into place.  Ask someone  to hold it for you, if possible.  Clearly mark and make sure that you can see:  Any grab bars or handrails.  First and last steps.  Where the edge of each step is.  Use tools that help you move around (mobility aids) if they are needed. These include:  Canes.  Walkers.  Scooters.  Crutches.  Turn on the lights when you go into  a dark area. Replace any light bulbs as soon as they burn out.  Set up your furniture so you have a clear path. Avoid moving your furniture around.  If any of your floors are uneven, fix them.  If there are any pets around you, be aware of where they are.  Review your medicines with your doctor. Some medicines can make you feel dizzy. This can increase your chance of falling. Ask your doctor what other things that you can do to help prevent falls.   This information is not intended to replace advice given to you by your health care provider. Make sure you discuss any questions you have with your health care provider.   Document Released: 01/04/2009 Document Revised: 07/25/2014 Document Reviewed: 04/14/2014 Elsevier Interactive Patient Education [employer].

## 2015-10-31 ENCOUNTER — Telehealth: Payer: Self-pay | Admitting: Neurology

## 2015-10-31 NOTE — Telephone Encounter (Signed)
Patient called, states she signed a records release last Wednesday and she still doesn't have records. Please call.

## 2015-11-01 DIAGNOSIS — E7800 Pure hypercholesterolemia, unspecified: Secondary | ICD-10-CM | POA: Diagnosis not present

## 2015-11-01 DIAGNOSIS — E119 Type 2 diabetes mellitus without complications: Secondary | ICD-10-CM | POA: Diagnosis not present

## 2015-11-01 DIAGNOSIS — I639 Cerebral infarction, unspecified: Secondary | ICD-10-CM | POA: Diagnosis not present

## 2015-11-01 NOTE — Telephone Encounter (Signed)
Pt records mailed to home address on 11/01/2015.

## 2015-11-16 DIAGNOSIS — Z853 Personal history of malignant neoplasm of breast: Secondary | ICD-10-CM | POA: Diagnosis not present

## 2015-11-20 ENCOUNTER — Encounter: Payer: Self-pay | Admitting: Internal Medicine

## 2015-11-29 DIAGNOSIS — E1165 Type 2 diabetes mellitus with hyperglycemia: Secondary | ICD-10-CM | POA: Diagnosis not present

## 2015-12-05 ENCOUNTER — Institutional Professional Consult (permissible substitution): Payer: Medicare Other | Admitting: Internal Medicine

## 2015-12-17 ENCOUNTER — Institutional Professional Consult (permissible substitution): Payer: Medicare Other | Admitting: Internal Medicine

## 2015-12-22 DIAGNOSIS — Z23 Encounter for immunization: Secondary | ICD-10-CM | POA: Diagnosis not present

## 2015-12-24 ENCOUNTER — Encounter: Payer: Self-pay | Admitting: Internal Medicine

## 2015-12-24 ENCOUNTER — Ambulatory Visit (INDEPENDENT_AMBULATORY_CARE_PROVIDER_SITE_OTHER): Payer: Medicare Other | Admitting: Internal Medicine

## 2015-12-24 ENCOUNTER — Encounter (INDEPENDENT_AMBULATORY_CARE_PROVIDER_SITE_OTHER): Payer: Self-pay

## 2015-12-24 VITALS — BP 122/70 | HR 66 | Ht 63.0 in | Wt 199.6 lb

## 2015-12-24 DIAGNOSIS — I639 Cerebral infarction, unspecified: Secondary | ICD-10-CM | POA: Diagnosis not present

## 2015-12-24 NOTE — Progress Notes (Signed)
ELECTROPHYSIOLOGY CONSULT NOTE  Patient ID: Crystal Rodriguez MRN: [id number], DOB/AGE: [birth year]   Admit date: (Not on file) Date of Consult: 12/24/2015  Primary Physician: Glenda Chroman, MD Reason for Consultation: Cryptogenic stroke; recommendations regarding Implantable Loop Recorder  History of Present Illness Crystal Rodriguez is referred for further evalatuion s/p stroke.  She developed stroke symptoms 12/16.  She had MRI 02/23/15 which revealed tiny multiple scattered R middle cerebral artery territory infractions.  She was started on plavix.  she has been monitored on telemetry which has demonstrated no arrhythmias. No cause has been identified. TEE has been performed and revealed PFO but not thrombus.  Past Medical History Past Medical History:  Diagnosis Date  . Esophageal spasm    2011- Dr Delfin Edis  . Hypertension    just started taking BP meds  . Hypothyroidism   . Stroke ([place])   . Vision abnormalities     Past Surgical History Past Surgical History:  Procedure Laterality Date  . BREAST SURGERY  11/13/11   Lt parial mastectomy  . left knee surgery    . TEE WITHOUT CARDIOVERSION N/A 05/28/2015   Procedure: TRANSESOPHAGEAL ECHOCARDIOGRAM (TEE);  Surgeon: Skeet Latch, MD;  Location: [facility] ENDOSCOPY;  Service: Cardiovascular;  Laterality: N/A;    Allergies/Intolerances Allergies  Allergen Reactions  . Bee Venom Swelling  . Penicillins Swelling    Has patient had a PCN reaction causing immediate rash, facial/tongue/throat swelling, SOB or lightheadedness with hypotension: No Has patient had a PCN reaction causing severe rash involving mucus membranes or skin necrosis: No Has patient had a PCN reaction that required hospitalization No Has patient had a PCN reaction occurring within the last 10 years: No If all of the above answers are "NO", then may proceed with Cephalosporin use.  . Sulfonamide Derivatives    Inpatient Medications   Social History Social  History   Social History  . Marital status: Widowed    Spouse name: N/A  . Number of children: N/A  . Years of education: N/A   Occupational History  . Not on file.   Social History Main Topics  . Smoking status: Never Smoker  . Smokeless tobacco: Never Used  . Alcohol use No  . Drug use: No  . Sexual activity: Not on file   Other Topics Concern  . Not on file   Social History Narrative  . No narrative on file    Review of Systems General: No chills, fever, night sweats or weight changes  Cardiovascular:  No chest pain, dyspnea on exertion, edema, orthopnea, palpitations, paroxysmal nocturnal dyspnea Dermatological: No rash, lesions or masses Respiratory: No cough, dyspnea Urologic: No hematuria, dysuria Abdominal: No nausea, vomiting, diarrhea, bright red blood per rectum, melena, or hematemesis Neurologic: No visual changes, weakness, changes in mental status All other systems reviewed and are otherwise negative except as noted above.  Physical Exam Blood pressure 122/70, pulse 66, height 5\' 3"  (1.6 m), weight 199 lb 9.6 oz (90.5 kg).  General: Well developed, well appearing 82 y.o. female in no acute distress. HEENT: Normocephalic, atraumatic. EOMs intact. Sclera nonicteric. Oropharynx clear.  Neck: Supple without bruits. No JVD. Lungs: Respirations regular and unlabored, CTA bilaterally. No wheezes, rales or rhonchi. Heart: RRR. S1, S2 present. No murmurs, rub, S3 or S4. Abdomen: Soft, non-tender, non-distended. BS present x 4 quadrants. No hepatosplenomegaly.  Extremities: No clubbing, cyanosis or edema. DP/PT/Radials 2+ and equal bilaterally. Psych: Normal affect. Neuro: Alert and oriented X 3. Moves all extremities  spontaneously. Musculoskeletal: No kyphosis. Skin: Intact. Warm and dry. No rashes or petechiae in exposed areas.   Labs Lab Results  Component Value Date   WBC 4.9 11/10/2011   HGB 11.4 (L) 11/10/2011   HCT 35.3 (L) 11/10/2011   MCV 83.3  11/10/2011   PLT 189 11/10/2011   No results for input(s): NA, K, CL, CO2, BUN, CREATININE, CALCIUM, PROT, BILITOT, ALKPHOS, ALT, AST, GLUCOSE in the last 168 hours.  Invalid input(s): LABALBU No results for input(s): INR in the last 72 hours.  Radiology/Studies No results found.  Echocardiogram  reviewed  12-lead ECG today reveals sinus rhythm 66 bpm, otherwise normal ekg  Assessment and Plan 1. Cryptogenic stroke The patient is s/p prior stroke.  MRI per Dr Leonie Man is worrisome for emboli as a source.  She has been monitoring previously during which she did not have afib detected.  I agree with Dr Leonie Man that long term monitoring with ILR would be beneficial.   The indication for loop recorder insertion / monitoring for AF in setting of cryptogenic stroke was discussed with the patient. The loop recorder insertion procedure was reviewed in detail including risks and benefits. These risks include but are not limited to bleeding and infection. The patient expressed verbal understanding and agrees to proceed.  We will therefore arrange the procedure at the next available time.  Army Fossa MD 12/24/2015, 9:20 PM

## 2015-12-24 NOTE — Patient Instructions (Signed)
Medication Instructions:  Your physician recommends that you continue on your current medications as directed. Please refer to the Current Medication list given to you today.   Labwork: None ordered   Testing/Procedures:  LINQ implant   Follow-Up: Your physician recommends that you schedule a follow-up appointment as needed  Call if you decide to proceed with LINQ monitor   Any Other Special Instructions Will Be Listed Below (If Applicable).     If you need a refill on your cardiac medications before your next appointment, please call your pharmacy.

## 2016-01-02 DIAGNOSIS — H43391 Other vitreous opacities, right eye: Secondary | ICD-10-CM | POA: Diagnosis not present

## 2016-01-16 DIAGNOSIS — E7800 Pure hypercholesterolemia, unspecified: Secondary | ICD-10-CM | POA: Diagnosis not present

## 2016-01-16 DIAGNOSIS — I639 Cerebral infarction, unspecified: Secondary | ICD-10-CM | POA: Diagnosis not present

## 2016-01-16 DIAGNOSIS — E119 Type 2 diabetes mellitus without complications: Secondary | ICD-10-CM | POA: Diagnosis not present

## 2016-01-28 DIAGNOSIS — Z23 Encounter for immunization: Secondary | ICD-10-CM | POA: Diagnosis not present

## 2016-01-31 DIAGNOSIS — L820 Inflamed seborrheic keratosis: Secondary | ICD-10-CM | POA: Diagnosis not present

## 2016-01-31 DIAGNOSIS — Z1283 Encounter for screening for malignant neoplasm of skin: Secondary | ICD-10-CM | POA: Diagnosis not present

## 2016-02-04 DIAGNOSIS — H26492 Other secondary cataract, left eye: Secondary | ICD-10-CM | POA: Diagnosis not present

## 2016-02-04 DIAGNOSIS — H43811 Vitreous degeneration, right eye: Secondary | ICD-10-CM | POA: Diagnosis not present

## 2016-02-04 DIAGNOSIS — H35371 Puckering of macula, right eye: Secondary | ICD-10-CM | POA: Diagnosis not present

## 2016-02-12 DIAGNOSIS — E7800 Pure hypercholesterolemia, unspecified: Secondary | ICD-10-CM | POA: Diagnosis not present

## 2016-02-12 DIAGNOSIS — I639 Cerebral infarction, unspecified: Secondary | ICD-10-CM | POA: Diagnosis not present

## 2016-02-12 DIAGNOSIS — E119 Type 2 diabetes mellitus without complications: Secondary | ICD-10-CM | POA: Diagnosis not present

## 2016-03-11 ENCOUNTER — Telehealth: Payer: Self-pay | Admitting: Internal Medicine

## 2016-03-11 NOTE — Telephone Encounter (Signed)
Returned call to patient and let her know it is LINQ monitor Dr Rayann Heman had discussed with her.  She understands now and will call back after discussing with her sister.  I have given her dates for Jan.

## 2016-03-11 NOTE — Telephone Encounter (Signed)
New message  Pt is calling in regards to a procedure that was discussed w/Dr. Charlesetta Shanks  Please call back and discuss

## 2016-03-20 DIAGNOSIS — E1165 Type 2 diabetes mellitus with hyperglycemia: Secondary | ICD-10-CM | POA: Diagnosis not present

## 2016-03-20 DIAGNOSIS — E7800 Pure hypercholesterolemia, unspecified: Secondary | ICD-10-CM | POA: Diagnosis not present

## 2016-03-20 DIAGNOSIS — Z7189 Other specified counseling: Secondary | ICD-10-CM | POA: Diagnosis not present

## 2016-03-20 DIAGNOSIS — Z299 Encounter for prophylactic measures, unspecified: Secondary | ICD-10-CM | POA: Diagnosis not present

## 2016-03-20 DIAGNOSIS — Z79899 Other long term (current) drug therapy: Secondary | ICD-10-CM | POA: Diagnosis not present

## 2016-03-20 DIAGNOSIS — Z1211 Encounter for screening for malignant neoplasm of colon: Secondary | ICD-10-CM | POA: Diagnosis not present

## 2016-03-20 DIAGNOSIS — Z1389 Encounter for screening for other disorder: Secondary | ICD-10-CM | POA: Diagnosis not present

## 2016-03-20 DIAGNOSIS — Z0000 Encounter for general adult medical examination without abnormal findings: Secondary | ICD-10-CM | POA: Diagnosis not present

## 2016-03-21 DIAGNOSIS — R5383 Other fatigue: Secondary | ICD-10-CM | POA: Diagnosis not present

## 2016-03-21 DIAGNOSIS — Z79899 Other long term (current) drug therapy: Secondary | ICD-10-CM | POA: Diagnosis not present

## 2016-03-21 DIAGNOSIS — E7800 Pure hypercholesterolemia, unspecified: Secondary | ICD-10-CM | POA: Diagnosis not present

## 2016-04-07 DIAGNOSIS — H26493 Other secondary cataract, bilateral: Secondary | ICD-10-CM | POA: Diagnosis not present

## 2016-04-07 DIAGNOSIS — H26491 Other secondary cataract, right eye: Secondary | ICD-10-CM | POA: Diagnosis not present

## 2016-04-07 DIAGNOSIS — Z961 Presence of intraocular lens: Secondary | ICD-10-CM | POA: Diagnosis not present

## 2016-04-07 DIAGNOSIS — H26492 Other secondary cataract, left eye: Secondary | ICD-10-CM | POA: Diagnosis not present

## 2016-04-07 DIAGNOSIS — H35372 Puckering of macula, left eye: Secondary | ICD-10-CM | POA: Diagnosis not present

## 2016-05-05 ENCOUNTER — Ambulatory Visit (INDEPENDENT_AMBULATORY_CARE_PROVIDER_SITE_OTHER): Payer: Medicare Other | Admitting: Neurology

## 2016-05-05 ENCOUNTER — Encounter: Payer: Self-pay | Admitting: Neurology

## 2016-05-05 VITALS — BP 112/59 | HR 67 | Wt 198.2 lb

## 2016-05-05 DIAGNOSIS — G3184 Mild cognitive impairment, so stated: Secondary | ICD-10-CM

## 2016-05-05 NOTE — Patient Instructions (Signed)
I had a long discussion with the patient, daughter and son with regards to her post stroke mild memory and cognitive impairment which appears to be stable. I recommend she discontinue aspirin and stay on Plavix alone for second stroke prevention as she is having bruising and did not see any long-term benefit of to antiplatelet therapy in stroke prevention. I encouraged her to stay on Lipitor and maintain strict control of lipids with LDL cholesterol goal below 70 mg percent. He also discussed memory compensation strategies. She can start Prevagen Daily As Well As Increased Perspiration in Cognitively Challenging Activities like Solving Crossword Puzzles, Playing Bridge of Sudoku. We Also Discussed Recent Evidence Suggesting the Modified Mediterranean Diet-MIND Diet Having Cognitive Benefits in Patients Following Strokes.   Heart-Healthy Eating Plan Introduction Heart-healthy meal planning includes:  Limiting unhealthy fats.  Increasing healthy fats.  Making other small dietary changes. You may need to talk with your doctor or a diet specialist (dietitian) to create an eating plan that is right for you. What types of fat should I choose?  Choose healthy fats. These include olive oil and canola oil, flaxseeds, walnuts, almonds, and seeds.  Eat more omega-3 fats. These include salmon, mackerel, sardines, tuna, flaxseed oil, and ground flaxseeds. Try to eat fish at least twice each week.  Limit saturated fats.  Saturated fats are often found in animal products, such as meats, butter, and cream.  Plant sources of saturated fats include palm oil, palm kernel oil, and coconut oil.  Avoid foods with partially hydrogenated oils in them. These include stick margarine, some tub margarines, cookies, crackers, and other baked goods. These contain trans fats. What general guidelines do I need to follow?  Check food labels carefully. Identify foods with trans fats or high amounts of saturated  fat.  Fill one half of your plate with vegetables and green salads. Eat 4-5 servings of vegetables per day. A serving of vegetables is:  1 cup of raw leafy vegetables.   cup of raw or cooked cut-up vegetables.   cup of vegetable juice.  Fill one fourth of your plate with whole grains. Look for the word "whole" as the first word in the ingredient list.  Fill one fourth of your plate with lean protein foods.  Eat 4-5 servings of fruit per day. A serving of fruit is:  One medium whole fruit.   cup of dried fruit.   cup of fresh, frozen, or canned fruit.   cup of 100% fruit juice.  Eat more foods that contain soluble fiber. These include apples, broccoli, carrots, beans, peas, and barley. Try to get 20-30 g of fiber per day.  Eat more home-cooked food. Eat less restaurant, buffet, and fast food.  Limit or avoid alcohol.  Limit foods high in starch and sugar.  Avoid fried foods.  Avoid frying your food. Try baking, boiling, grilling, or broiling it instead. You can also reduce fat by:  Removing the skin from poultry.  Removing all visible fats from meats.  Skimming the fat off of stews, soups, and gravies before serving them.  Steaming vegetables in water or broth.  Lose weight if you are overweight.  Eat 4-5 servings of nuts, legumes, and seeds per week:  One serving of dried beans or legumes equals  cup after being cooked.  One serving of nuts equals 1 ounces.  One serving of seeds equals  ounce or one tablespoon.  You may need to keep track of how much salt or sodium you eat.  This is especially true if you have high blood pressure. Talk with your doctor or dietitian to get more information. What foods can I eat? Grains  Breads, including [place], white, pita, wheat, raisin, rye, oatmeal, and [place]. Tortillas that are neither fried nor made with lard or trans fat. Low-fat rolls, including hotdog and hamburger buns and English muffins. Biscuits. Muffins.  Waffles. Pancakes. Light popcorn. Whole-grain cereals. Flatbread. Melba toast. Pretzels. Breadsticks. Rusks. Low-fat snacks. Low-fat crackers, including oyster, saltine, matzo, graham, animal, and rye. Rice and pasta, including brown rice and pastas that are made with whole wheat. Vegetables  All vegetables. Fruits  All fruits, but limit coconut. Meats and Other Protein Sources  Lean, well-trimmed beef, veal, pork, and lamb. Chicken and [place] without skin. All fish and shellfish. Wild duck, rabbit, pheasant, and venison. Egg whites or low-cholesterol egg substitutes. Dried beans, peas, lentils, and tofu. Seeds and most nuts. Dairy  Low-fat or nonfat cheeses, including ricotta, string, and mozzarella. Skim or 1% milk that is liquid, powdered, or evaporated. Buttermilk that is made with low-fat milk. Nonfat or low-fat yogurt. Beverages  Mineral water. Diet carbonated beverages. Sweets and Desserts  Sherbets and fruit ices. Honey, jam, marmalade, jelly, and syrups. Meringues and gelatins. Pure sugar candy, such as hard candy, jelly beans, gumdrops, mints, marshmallows, and small amounts of dark chocolate. [employer] Eat all sweets and desserts in moderation. Fats and Oils  Nonhydrogenated (trans-free) margarines. Vegetable oils, including soybean, sesame, sunflower, olive, peanut, safflower, corn, canola, and cottonseed. Salad dressings or mayonnaise made with a vegetable oil. Limit added fats and oils that you use for cooking, baking, salads, and as spreads. Other  Cocoa powder. Coffee and tea. All seasonings and condiments. The items listed above may not be a complete list of recommended foods or beverages. Contact your dietitian for more options.  What foods are not recommended? Grains  Breads that are made with saturated or trans fats, oils, or whole milk. Croissants. Butter rolls. Cheese breads. Sweet rolls. Donuts. Buttered popcorn. Chow mein noodles. High-fat crackers, such as cheese  or butter crackers. Meats and Other Protein Sources  Fatty meats, such as hotdogs, short ribs, sausage, spareribs, bacon, rib eye roast or steak, and mutton. High-fat deli meats, such as salami and bologna. Caviar. Domestic duck and goose. Organ meats, such as kidney, liver, sweetbreads, and heart. Dairy  Cream, sour cream, cream cheese, and creamed cottage cheese. Whole-milk cheeses, including blue (bleu), Monte[place] Jack, [place], [place], American, [place], Swiss, cheddar, [place], and [place]. Whole or 2% milk that is liquid, evaporated, or condensed. Whole buttermilk. Cream sauce or high-fat cheese sauce. Yogurt that is made from whole milk. Beverages  Regular sodas and juice drinks with added sugar. Sweets and Desserts  Frosting. Pudding. Cookies. Cakes other than angel food cake. Candy that has milk chocolate or white chocolate, hydrogenated fat, butter, coconut, or unknown ingredients. Buttered syrups. Full-fat ice cream or ice cream drinks. Fats and Oils  Gravy that has suet, meat fat, or shortening. Cocoa butter, hydrogenated oils, palm oil, coconut oil, palm kernel oil. These can often be found in baked products, candy, fried foods, nondairy creamers, and whipped toppings. Solid fats and shortenings, including bacon fat, salt pork, lard, and butter. Nondairy cream substitutes, such as coffee creamers and sour cream substitutes. Salad dressings that are made of unknown oils, cheese, or sour cream. The items listed above may not be a complete list of foods and beverages to avoid. Contact your dietitian for more information.  This information is not intended to replace advice given to you by your health care provider. Make sure you discuss any questions you have with your health care provider. Document Released: 09/09/2011 Document Revised: 08/16/2015 Document Reviewed: 09/01/2013  2017 Elsevier
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
## 2016-05-12 DIAGNOSIS — Z6832 Body mass index (BMI) 32.0-32.9, adult: Secondary | ICD-10-CM | POA: Diagnosis not present

## 2016-05-12 DIAGNOSIS — I10 Essential (primary) hypertension: Secondary | ICD-10-CM | POA: Diagnosis not present

## 2016-05-12 DIAGNOSIS — Z713 Dietary counseling and surveillance: Secondary | ICD-10-CM | POA: Diagnosis not present

## 2016-05-12 DIAGNOSIS — E1165 Type 2 diabetes mellitus with hyperglycemia: Secondary | ICD-10-CM | POA: Diagnosis not present

## 2016-05-12 DIAGNOSIS — Z789 Other specified health status: Secondary | ICD-10-CM | POA: Diagnosis not present

## 2016-05-12 DIAGNOSIS — J069 Acute upper respiratory infection, unspecified: Secondary | ICD-10-CM | POA: Diagnosis not present

## 2016-05-12 DIAGNOSIS — Z299 Encounter for prophylactic measures, unspecified: Secondary | ICD-10-CM | POA: Diagnosis not present

## 2016-05-12 DIAGNOSIS — I639 Cerebral infarction, unspecified: Secondary | ICD-10-CM | POA: Diagnosis not present

## 2016-05-12 DIAGNOSIS — C50919 Malignant neoplasm of unspecified site of unspecified female breast: Secondary | ICD-10-CM | POA: Diagnosis not present

## 2016-05-12 DIAGNOSIS — E039 Hypothyroidism, unspecified: Secondary | ICD-10-CM | POA: Diagnosis not present

## 2016-06-18 ENCOUNTER — Telehealth: Payer: Self-pay | Admitting: Internal Medicine

## 2016-06-18 NOTE — Telephone Encounter (Signed)
Mrs. Megna called [facility] stating that she is interested in talking to Dr. Rayann Heman again about the procedure he had mentioned to her last year. States that she has been swelling in her feet and legs.  States that she is taking fluid pills. She states that she is fatigue and just not feeling well for several days.

## 2016-06-18 NOTE — Telephone Encounter (Signed)
Patient is wanting to talk with Dr. Rayann Heman about the [facility] implant. Does not want to have another office visit, patient is ready to have it done. If Dr. Rayann Heman wants to wait until mammogram in June to see if cancer is gone she is fine waiting. She doesn't want to have "too many visits." Also taking lasix 20mg  as needed with potassium still having increased swelling (more on right side). Patient stated she does not want to be on anymore pills. Advised patient she needs to be keeping her feet elevated. Routed to Dr. Rayann Heman.

## 2016-06-20 NOTE — Telephone Encounter (Signed)
Will have Chanetta Marshall NP discuss with patient by phone to determine next steps.

## 2016-06-24 DIAGNOSIS — I10 Essential (primary) hypertension: Secondary | ICD-10-CM | POA: Diagnosis not present

## 2016-06-24 DIAGNOSIS — Z6831 Body mass index (BMI) 31.0-31.9, adult: Secondary | ICD-10-CM | POA: Diagnosis not present

## 2016-06-24 DIAGNOSIS — E1165 Type 2 diabetes mellitus with hyperglycemia: Secondary | ICD-10-CM | POA: Diagnosis not present

## 2016-06-24 DIAGNOSIS — E781 Pure hyperglyceridemia: Secondary | ICD-10-CM | POA: Diagnosis not present

## 2016-06-24 DIAGNOSIS — C50919 Malignant neoplasm of unspecified site of unspecified female breast: Secondary | ICD-10-CM | POA: Diagnosis not present

## 2016-06-24 DIAGNOSIS — Z713 Dietary counseling and surveillance: Secondary | ICD-10-CM | POA: Diagnosis not present

## 2016-06-24 DIAGNOSIS — I639 Cerebral infarction, unspecified: Secondary | ICD-10-CM | POA: Diagnosis not present

## 2016-06-24 DIAGNOSIS — M810 Age-related osteoporosis without current pathological fracture: Secondary | ICD-10-CM | POA: Diagnosis not present

## 2016-06-24 DIAGNOSIS — E039 Hypothyroidism, unspecified: Secondary | ICD-10-CM | POA: Diagnosis not present

## 2016-06-24 DIAGNOSIS — K219 Gastro-esophageal reflux disease without esophagitis: Secondary | ICD-10-CM | POA: Diagnosis not present

## 2016-06-24 DIAGNOSIS — Z299 Encounter for prophylactic measures, unspecified: Secondary | ICD-10-CM | POA: Diagnosis not present

## 2016-06-24 NOTE — Telephone Encounter (Signed)
Spoke with patient. ILR scheduled for Tuesday, April 17th with Dr Rayann Heman.  Pt to arrive to Short Stay at 6:30AM. No restrictions prior to procedure. She is going to check with her sister to be sure they can come that day and will call back this afternoon.   Chanetta Marshall, NP 06/24/2016 8:53 AM

## 2016-07-04 ENCOUNTER — Telehealth: Payer: Self-pay | Admitting: Internal Medicine

## 2016-07-04 NOTE — Telephone Encounter (Signed)
She wants to cancel the procedure until after she has her mammogram.  I asked her why and she was just worried about  It being near area where she had CA.  She is also worried about not understanding how to use remote monitoring for LINQ.  I let her know that this would all be explained to her at the hospital and she should not worry as it is a simple procedure.  She appreciated my call and is going to keep as scheduled.

## 2016-07-04 NOTE — Telephone Encounter (Signed)
New Message  Pt voiced she would like to cancel upcoming procedure.  Please f/u

## 2016-07-07 ENCOUNTER — Telehealth: Payer: Self-pay | Admitting: Internal Medicine

## 2016-07-07 ENCOUNTER — Telehealth: Payer: Self-pay | Admitting: Cardiovascular Disease

## 2016-07-07 NOTE — Telephone Encounter (Signed)
New message      Pt is due to have a loop recorder implant tomorrow.  She want to talk to a nurse.  She is having second thoughts.  Please call

## 2016-07-07 NOTE — Telephone Encounter (Signed)
Follow Up:   Pt said she was checking to see if her procedure was still on for tomorrow please?

## 2016-07-07 NOTE — Telephone Encounter (Signed)
Spoke with patient and let her know still on for tomorrow.

## 2016-07-07 NOTE — Telephone Encounter (Signed)
Incorrect MD listed on telephone encounter.  Will forward to Dr Jackalyn Lombard nurse.

## 2016-07-07 NOTE — Telephone Encounter (Signed)
Called patient and she is still having second thoughts in regards to [facility] implant.  Will cancel for tomorrow and she will call back if she decides to proceed after doing more research about the device

## 2016-07-08 ENCOUNTER — Ambulatory Visit (HOSPITAL_COMMUNITY): Admission: RE | Admit: 2016-07-08 | Payer: Medicare Other | Source: Ambulatory Visit | Admitting: Internal Medicine

## 2016-07-08 ENCOUNTER — Encounter (HOSPITAL_COMMUNITY): Admission: RE | Payer: Self-pay | Source: Ambulatory Visit

## 2016-07-08 ENCOUNTER — Encounter: Payer: Self-pay | Admitting: Gastroenterology

## 2016-07-08 SURGERY — LOOP RECORDER INSERTION

## 2016-07-10 DIAGNOSIS — E119 Type 2 diabetes mellitus without complications: Secondary | ICD-10-CM | POA: Diagnosis not present

## 2016-07-10 DIAGNOSIS — I639 Cerebral infarction, unspecified: Secondary | ICD-10-CM | POA: Diagnosis not present

## 2016-07-10 DIAGNOSIS — E7800 Pure hypercholesterolemia, unspecified: Secondary | ICD-10-CM | POA: Diagnosis not present

## 2016-07-16 DIAGNOSIS — I10 Essential (primary) hypertension: Secondary | ICD-10-CM | POA: Diagnosis not present

## 2016-07-16 DIAGNOSIS — Z6835 Body mass index (BMI) 35.0-35.9, adult: Secondary | ICD-10-CM | POA: Diagnosis not present

## 2016-07-16 DIAGNOSIS — R5383 Other fatigue: Secondary | ICD-10-CM | POA: Diagnosis not present

## 2016-07-16 DIAGNOSIS — E1165 Type 2 diabetes mellitus with hyperglycemia: Secondary | ICD-10-CM | POA: Diagnosis not present

## 2016-07-16 DIAGNOSIS — E039 Hypothyroidism, unspecified: Secondary | ICD-10-CM | POA: Diagnosis not present

## 2016-07-16 DIAGNOSIS — Z299 Encounter for prophylactic measures, unspecified: Secondary | ICD-10-CM | POA: Diagnosis not present

## 2016-08-04 DIAGNOSIS — L820 Inflamed seborrheic keratosis: Secondary | ICD-10-CM | POA: Diagnosis not present

## 2016-08-06 ENCOUNTER — Encounter (INDEPENDENT_AMBULATORY_CARE_PROVIDER_SITE_OTHER): Payer: Self-pay | Admitting: Orthopaedic Surgery

## 2016-08-06 ENCOUNTER — Ambulatory Visit (INDEPENDENT_AMBULATORY_CARE_PROVIDER_SITE_OTHER): Payer: Medicare Other

## 2016-08-06 ENCOUNTER — Ambulatory Visit (INDEPENDENT_AMBULATORY_CARE_PROVIDER_SITE_OTHER): Payer: Medicare Other | Admitting: Orthopaedic Surgery

## 2016-08-06 VITALS — BP 154/77 | HR 72 | Ht 67.0 in | Wt 198.0 lb

## 2016-08-06 DIAGNOSIS — M25531 Pain in right wrist: Secondary | ICD-10-CM | POA: Diagnosis not present

## 2016-08-06 NOTE — Progress Notes (Signed)
Office Visit Note   Patient: Crystal Rodriguez           Date of Birth: 08/31/1932           MRN: [id number] Visit Date: 08/06/2016              Requested by: Glenda Chroman, MD [place], [place] [address] PCP: Glenda Chroman, MD   Assessment & Plan: Visit Diagnoses:  1. Pain in right wrist   Status post open reduction internal fixation of displaced right distal radius fracture. Fracture line is not visible by plain film. I believe the hardware is in excellent position. I believe the pain is probably related to the arthritis in the distal row carpus and the base of the thumb  Plan: We tried a splint but she wasn't sure that it would make much of a difference. I reassured Crystal Rodriguez that the wrist fracture has healed nicely without problem with the hardware. I believe the problem that she is experiencing is most likely related to her arthritis as above.  Follow-Up Instructions: Return if symptoms worsen or fail to improve.   Orders:  Orders Placed This Encounter  Procedures  . XR Wrist Complete Right   No orders of the defined types were placed in this encounter.     Procedures: No procedures performed   Clinical Data: No additional findings.   Subjective: Chief Complaint  Patient presents with  . Right Wrist - Pain  Crystal Rodriguez is months status post open reduction internal fixation of a displaced right distal radius fracture. She's recently had some weakness of her hand without injury or trauma. She is concerned that there may be something wrong with the hardware or the prior fracture. She's awareof the arthritis at the base of the thumb. Denies numbness or tingling. No functional loss of motion  HPI  Review of Systems   Objective: Vital Signs: BP (!) 154/77   Pulse 72   Ht 5\' 7"  (1.702 m)   Wt 198 lb (89.8 kg)   BMI 31.01 kg/m   Physical Exam  Ortho Exam right wrist   With full pronation and supination. 55 of dorsiflexion and about the same of  full flexion without any localized tenderness about the wrist. Old incision is healed nicely. Neurovascular exam intact. Pain and positive grind at the base of the right thumb  Imaging: Xr Wrist Complete Right  Result Date: 08/06/2016 Films of the right wrist were obtained in 3 projections. Prior open reduction internal fixation of the distal radius was performed with plate and screws. The fracture is not visible. Distal radius is in excellent position. I believe the screws are within the distal radius and not extruded. There is considerable degenerative change in the distal row carpus and particularly at the base of the thumb    PMFS History: Patient Active Problem List   Diagnosis Date Noted  . Chronic ischemic right MCA stroke   . Embolic stroke ([place]) 88/32/5498  . Breast cancer, left breast ([place]) 10/29/2011  . HYPOTHYROIDISM 01/15/2009  . DIABETES MELLITUS, TYPE II 01/15/2009  . HYPERCHOLESTEROLEMIA 01/15/2009  . DEPRESSION 01/15/2009  . DIVERTICULOSIS, COLON 01/15/2009  . DYSPHAGIA UNSPECIFIED 01/15/2009   Past Medical History:  Diagnosis Date  . Esophageal spasm    2011- Dr Delfin Edis  . Hypertension    just started taking BP meds  . Hypothyroidism   . Stroke ([place])   . Vision abnormalities     Family History  Problem Relation Age of Onset  . Heart disease Mother   . Diabetes Mother   . Cancer Mother        Theadora Rama    Past Surgical History:  Procedure Laterality Date  . BREAST SURGERY  11/13/11   Lt parial mastectomy  . left knee surgery    . TEE WITHOUT CARDIOVERSION N/A 05/28/2015   Procedure: TRANSESOPHAGEAL ECHOCARDIOGRAM (TEE);  Surgeon: Skeet Latch, MD;  Location: [facility];  Service: Cardiovascular;  Laterality: N/A;   Social History   Occupational History  . Not on file.   Social History Main Topics  . Smoking status: Never Smoker  . Smokeless tobacco: Never Used  . Alcohol use No  . Drug use: No  . Sexual activity: Not on file     Garald Balding, MD   Note - This record has been created using [employer].  Chart creation errors have been sought, but may not always  have been located. Such creation errors do not reflect on  the standard of medical care.

## 2016-08-19 DIAGNOSIS — E7800 Pure hypercholesterolemia, unspecified: Secondary | ICD-10-CM | POA: Diagnosis not present

## 2016-08-19 DIAGNOSIS — E119 Type 2 diabetes mellitus without complications: Secondary | ICD-10-CM | POA: Diagnosis not present

## 2016-08-19 DIAGNOSIS — I639 Cerebral infarction, unspecified: Secondary | ICD-10-CM | POA: Diagnosis not present

## 2016-09-30 DIAGNOSIS — R928 Other abnormal and inconclusive findings on diagnostic imaging of breast: Secondary | ICD-10-CM | POA: Diagnosis not present

## 2016-09-30 DIAGNOSIS — Z853 Personal history of malignant neoplasm of breast: Secondary | ICD-10-CM | POA: Diagnosis not present

## 2016-10-30 DIAGNOSIS — Z6834 Body mass index (BMI) 34.0-34.9, adult: Secondary | ICD-10-CM | POA: Diagnosis not present

## 2016-10-30 DIAGNOSIS — Z299 Encounter for prophylactic measures, unspecified: Secondary | ICD-10-CM | POA: Diagnosis not present

## 2016-10-30 DIAGNOSIS — I639 Cerebral infarction, unspecified: Secondary | ICD-10-CM | POA: Diagnosis not present

## 2016-10-30 DIAGNOSIS — E1165 Type 2 diabetes mellitus with hyperglycemia: Secondary | ICD-10-CM | POA: Diagnosis not present

## 2016-10-30 DIAGNOSIS — I10 Essential (primary) hypertension: Secondary | ICD-10-CM | POA: Diagnosis not present

## 2016-10-30 DIAGNOSIS — C50919 Malignant neoplasm of unspecified site of unspecified female breast: Secondary | ICD-10-CM | POA: Diagnosis not present

## 2016-11-03 ENCOUNTER — Ambulatory Visit: Payer: Medicare Other | Admitting: Neurology

## 2016-11-05 DIAGNOSIS — E7800 Pure hypercholesterolemia, unspecified: Secondary | ICD-10-CM | POA: Diagnosis not present

## 2016-11-05 DIAGNOSIS — I639 Cerebral infarction, unspecified: Secondary | ICD-10-CM | POA: Diagnosis not present

## 2016-11-05 DIAGNOSIS — E119 Type 2 diabetes mellitus without complications: Secondary | ICD-10-CM | POA: Diagnosis not present

## 2016-11-07 DIAGNOSIS — Z853 Personal history of malignant neoplasm of breast: Secondary | ICD-10-CM | POA: Diagnosis not present

## 2016-11-18 ENCOUNTER — Encounter: Payer: Self-pay | Admitting: Neurology

## 2016-11-18 ENCOUNTER — Ambulatory Visit (INDEPENDENT_AMBULATORY_CARE_PROVIDER_SITE_OTHER): Payer: Medicare Other | Admitting: Neurology

## 2016-11-18 VITALS — BP 141/63 | HR 76 | Wt 190.6 lb

## 2016-11-18 DIAGNOSIS — G3184 Mild cognitive impairment, so stated: Secondary | ICD-10-CM | POA: Diagnosis not present

## 2016-11-18 DIAGNOSIS — I6529 Occlusion and stenosis of unspecified carotid artery: Secondary | ICD-10-CM | POA: Diagnosis not present

## 2016-11-18 MED ORDER — FISH OIL 1200 MG PO CAPS: 1.0000 | ORAL_CAPSULE | ORAL | 0 refills | Status: AC

## 2016-11-18 NOTE — Patient Instructions (Addendum)
I had a long discussion with the patient and her sister regarding her memory loss and mild cognitive impairment which appears to be stable. She is reluctant to try Aricept and Namenda but is willing to try fish oil. I also discussed memory compensation strategies and encouraged her to part sparing in cognitively challenging activities. She may also consider possible participation in the [place] study if interested. Check screening follow-up carotid ultrasound study. Discontinue aspirin and stay on Plavix alone for stroke prevention.   Maintain strict control of hypertension with blood pressure goal below 130/90 and lipids with LDL cholesterol goal below 70 mg percent. Return for follow-up in 6 months or call earlier if necessary. Memory Compensation Strategies  1. Use "WARM" strategy.  W= write it down  A= associate it  R= repeat it  M= make a mental note  2.   You can keep a Social worker.  Use a 3-ring notebook with sections for the following: calendar, important names and phone numbers,  medications, doctors' names/phone numbers, lists/reminders, and a section to journal what you did  each day.   3.    Use a calendar to write appointments down.  4.    Write yourself a schedule for the day.  This can be placed on the calendar or in a separate section of the Memory Notebook.  Keeping a  regular schedule can help memory.  5.    Use medication organizer with sections for each day or morning/evening pills.  You may need help loading it  6.    Keep a basket, or pegboard by the door.  Place items that you need to take out with you in the basket or on the pegboard.  You may also want to  include a message board for reminders.  7.    Use sticky notes.  Place sticky notes with reminders in a place where the task is performed.  For example: " turn off the  stove" placed by the stove, "lock the door" placed on the door at eye level, " take your medications" on  the bathroom mirror or by the  place where you normally take your medications.  8.    Use alarms/timers.  Use while cooking to remind yourself to check on food or as a reminder to take your medicine, or as a  reminder to make a call, or as a reminder to perform another task, etc.
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
## 2017-01-06 DIAGNOSIS — R3915 Urgency of urination: Secondary | ICD-10-CM | POA: Diagnosis not present

## 2017-01-06 DIAGNOSIS — Z299 Encounter for prophylactic measures, unspecified: Secondary | ICD-10-CM | POA: Diagnosis not present

## 2017-01-06 DIAGNOSIS — N390 Urinary tract infection, site not specified: Secondary | ICD-10-CM | POA: Diagnosis not present

## 2017-01-06 DIAGNOSIS — Z6835 Body mass index (BMI) 35.0-35.9, adult: Secondary | ICD-10-CM | POA: Diagnosis not present

## 2017-01-06 DIAGNOSIS — E039 Hypothyroidism, unspecified: Secondary | ICD-10-CM | POA: Diagnosis not present

## 2017-01-06 DIAGNOSIS — I10 Essential (primary) hypertension: Secondary | ICD-10-CM | POA: Diagnosis not present

## 2017-01-06 DIAGNOSIS — R5383 Other fatigue: Secondary | ICD-10-CM | POA: Diagnosis not present

## 2017-01-08 ENCOUNTER — Telehealth: Payer: Self-pay | Admitting: Neurology

## 2017-01-08 NOTE — Telephone Encounter (Addendum)
Pt called she is wanting to have a carotid test. Crystal Rodriguez she has never had this test and is being encouraged by friends to have it. Please call to discuss, pt is aware Dr Leonie Man will be in the clinic next week and this can wait until then. Pt also said her balance and memory is a little worse.

## 2017-01-12 ENCOUNTER — Other Ambulatory Visit: Payer: Self-pay

## 2017-01-12 DIAGNOSIS — I6529 Occlusion and stenosis of unspecified carotid artery: Secondary | ICD-10-CM

## 2017-01-12 DIAGNOSIS — Z23 Encounter for immunization: Secondary | ICD-10-CM | POA: Diagnosis not present

## 2017-01-12 NOTE — Telephone Encounter (Signed)
Order sent to [facility] for carotid doppler.

## 2017-01-12 NOTE — Telephone Encounter (Signed)
Katrina and Dr. Leonie Man this patient this order number needs to be changed to [id number] . So this order Never dumped in [place] que to schedule. Can you please re- order and I will get patient an apt this week. Thanks Hinton Dyer.

## 2017-01-26 ENCOUNTER — Ambulatory Visit (HOSPITAL_COMMUNITY)
Admission: RE | Admit: 2017-01-26 | Discharge: 2017-01-26 | Disposition: A | Discharge status: Home / Self Care | Payer: Medicare Other | Source: Ambulatory Visit | Attending: Neurology | Admitting: Neurology

## 2017-01-26 DIAGNOSIS — I6523 Occlusion and stenosis of bilateral carotid arteries: Secondary | ICD-10-CM | POA: Insufficient documentation

## 2017-01-26 DIAGNOSIS — I6529 Occlusion and stenosis of unspecified carotid artery: Secondary | ICD-10-CM | POA: Diagnosis not present

## 2017-01-26 NOTE — Progress Notes (Signed)
VASCULAR LAB PRELIMINARY  PRELIMINARY  PRELIMINARY  PRELIMINARY  Carotid duplexcompleted.    Preliminary report:  1-39% ICA stenosis.  Vertebral artery flow is antegrade.   Rubye Strohmeyer, RVT 01/26/2017, 12:17 PM

## 2017-01-27 ENCOUNTER — Telehealth: Payer: Self-pay

## 2017-01-27 NOTE — Telephone Encounter (Signed)
-----   Message from Garvin Fila, MD sent at 01/26/2017  5:14 PM EST ----- Kindly inform the patient that carotid ultrasound study showed no significant extracranial stenosis on either side

## 2017-01-27 NOTE — Telephone Encounter (Signed)
Rn call patient about her carotid ultrasound. It showed not significant extracranial stenosis on either side. Pt verbalized understanding. ------

## 2017-02-02 DIAGNOSIS — Z1283 Encounter for screening for malignant neoplasm of skin: Secondary | ICD-10-CM | POA: Diagnosis not present

## 2017-02-11 DIAGNOSIS — Z6833 Body mass index (BMI) 33.0-33.9, adult: Secondary | ICD-10-CM | POA: Diagnosis not present

## 2017-02-11 DIAGNOSIS — C50919 Malignant neoplasm of unspecified site of unspecified female breast: Secondary | ICD-10-CM | POA: Diagnosis not present

## 2017-02-11 DIAGNOSIS — R413 Other amnesia: Secondary | ICD-10-CM | POA: Diagnosis not present

## 2017-02-11 DIAGNOSIS — Z299 Encounter for prophylactic measures, unspecified: Secondary | ICD-10-CM | POA: Diagnosis not present

## 2017-02-11 DIAGNOSIS — E1165 Type 2 diabetes mellitus with hyperglycemia: Secondary | ICD-10-CM | POA: Diagnosis not present

## 2017-02-18 DIAGNOSIS — I639 Cerebral infarction, unspecified: Secondary | ICD-10-CM | POA: Diagnosis not present

## 2017-02-18 DIAGNOSIS — E119 Type 2 diabetes mellitus without complications: Secondary | ICD-10-CM | POA: Diagnosis not present

## 2017-02-18 DIAGNOSIS — E7800 Pure hypercholesterolemia, unspecified: Secondary | ICD-10-CM | POA: Diagnosis not present

## 2017-05-15 DIAGNOSIS — E1165 Type 2 diabetes mellitus with hyperglycemia: Secondary | ICD-10-CM | POA: Diagnosis not present

## 2017-05-15 DIAGNOSIS — E039 Hypothyroidism, unspecified: Secondary | ICD-10-CM | POA: Diagnosis not present

## 2017-05-15 DIAGNOSIS — Z0000 Encounter for general adult medical examination without abnormal findings: Secondary | ICD-10-CM | POA: Diagnosis not present

## 2017-05-15 DIAGNOSIS — Z1339 Encounter for screening examination for other mental health and behavioral disorders: Secondary | ICD-10-CM | POA: Diagnosis not present

## 2017-05-15 DIAGNOSIS — Z299 Encounter for prophylactic measures, unspecified: Secondary | ICD-10-CM | POA: Diagnosis not present

## 2017-05-15 DIAGNOSIS — Z79899 Other long term (current) drug therapy: Secondary | ICD-10-CM | POA: Diagnosis not present

## 2017-05-15 DIAGNOSIS — Z1331 Encounter for screening for depression: Secondary | ICD-10-CM | POA: Diagnosis not present

## 2017-05-15 DIAGNOSIS — D649 Anemia, unspecified: Secondary | ICD-10-CM | POA: Diagnosis not present

## 2017-05-15 DIAGNOSIS — I10 Essential (primary) hypertension: Secondary | ICD-10-CM | POA: Diagnosis not present

## 2017-05-15 DIAGNOSIS — R5383 Other fatigue: Secondary | ICD-10-CM | POA: Diagnosis not present

## 2017-05-15 DIAGNOSIS — Z7189 Other specified counseling: Secondary | ICD-10-CM | POA: Diagnosis not present

## 2017-05-15 DIAGNOSIS — Z6834 Body mass index (BMI) 34.0-34.9, adult: Secondary | ICD-10-CM | POA: Diagnosis not present

## 2017-05-15 DIAGNOSIS — C50919 Malignant neoplasm of unspecified site of unspecified female breast: Secondary | ICD-10-CM | POA: Diagnosis not present

## 2017-05-18 ENCOUNTER — Ambulatory Visit: Payer: Medicare Other | Admitting: Neurology

## 2017-06-02 DIAGNOSIS — Z299 Encounter for prophylactic measures, unspecified: Secondary | ICD-10-CM | POA: Diagnosis not present

## 2017-06-02 DIAGNOSIS — E2839 Other primary ovarian failure: Secondary | ICD-10-CM | POA: Diagnosis not present

## 2017-06-02 DIAGNOSIS — M8580 Other specified disorders of bone density and structure, unspecified site: Secondary | ICD-10-CM | POA: Diagnosis not present

## 2017-06-02 DIAGNOSIS — E039 Hypothyroidism, unspecified: Secondary | ICD-10-CM | POA: Diagnosis not present

## 2017-06-02 DIAGNOSIS — Z6832 Body mass index (BMI) 32.0-32.9, adult: Secondary | ICD-10-CM | POA: Diagnosis not present

## 2017-06-02 DIAGNOSIS — E7800 Pure hypercholesterolemia, unspecified: Secondary | ICD-10-CM | POA: Diagnosis not present

## 2017-06-02 DIAGNOSIS — I10 Essential (primary) hypertension: Secondary | ICD-10-CM | POA: Diagnosis not present

## 2017-06-02 DIAGNOSIS — E1165 Type 2 diabetes mellitus with hyperglycemia: Secondary | ICD-10-CM | POA: Diagnosis not present

## 2017-06-02 DIAGNOSIS — C50919 Malignant neoplasm of unspecified site of unspecified female breast: Secondary | ICD-10-CM | POA: Diagnosis not present

## 2017-06-10 ENCOUNTER — Telehealth (INDEPENDENT_AMBULATORY_CARE_PROVIDER_SITE_OTHER): Payer: Self-pay | Admitting: *Deleted

## 2017-06-10 ENCOUNTER — Ambulatory Visit (INDEPENDENT_AMBULATORY_CARE_PROVIDER_SITE_OTHER): Payer: Medicare Other | Admitting: Internal Medicine

## 2017-06-10 ENCOUNTER — Encounter (INDEPENDENT_AMBULATORY_CARE_PROVIDER_SITE_OTHER): Payer: Self-pay | Admitting: Internal Medicine

## 2017-06-10 ENCOUNTER — Encounter (INDEPENDENT_AMBULATORY_CARE_PROVIDER_SITE_OTHER): Payer: Self-pay | Admitting: *Deleted

## 2017-06-10 VITALS — BP 128/62 | HR 64 | Temp 97.8°F | Ht 65.0 in | Wt 188.8 lb

## 2017-06-10 DIAGNOSIS — D508 Other iron deficiency anemias: Secondary | ICD-10-CM

## 2017-06-10 DIAGNOSIS — D649 Anemia, unspecified: Secondary | ICD-10-CM | POA: Insufficient documentation

## 2017-06-10 NOTE — Telephone Encounter (Signed)
Patient needs trilyte 

## 2017-06-10 NOTE — Progress Notes (Addendum)
Subjective:    Patient ID: Crystal Rodriguez, female    DOB: 06/14/1932, 84 y.o.   MRN: [id number]  HPI Referred by Dr. Woody Seller for anemia/colonoscopy.  She says she denies prior hx of anemia. She has not seen any rectal bleeding. She has lost about 10 pounds in the last couple of months.  No change in stools. Stools are black from the iron.  She is having x 1 day.  Hx of breast cancer and followed by the Diabetes, CVA maintained on Plavix, high cholesterol, hypothyroid.  Family hx of Crohn's disease. No family hx of colon cancer. Her last colonoscopy was in 2008  DR. Brody (Screening) Diverticulosis in descending to sigmoid cold. Mild sigmoid diverticulosis.   05/16/2008  H and H 9.6 an 30.5, MCV 69.  Review of Systems Past Medical History:  Diagnosis Date  . Esophageal spasm    2011- Dr Delfin Edis  . Hypertension    just started taking BP meds  . Hypothyroidism   . Stroke ([place])   . Vision abnormalities     Past Surgical History:  Procedure Laterality Date  . BREAST SURGERY  11/13/11   Lt parial mastectomy  . left knee surgery    . TEE WITHOUT CARDIOVERSION N/A 05/28/2015   Procedure: TRANSESOPHAGEAL ECHOCARDIOGRAM (TEE);  Surgeon: Skeet Latch, MD;  Location: [facility] ENDOSCOPY;  Service: Cardiovascular;  Laterality: N/A;    Allergies  Allergen Reactions  . Bee Venom Swelling  . Penicillins Swelling    Has patient had a PCN reaction causing immediate rash, facial/tongue/throat swelling, SOB or lightheadedness with hypotension: No Has patient had a PCN reaction causing severe rash involving mucus membranes or skin necrosis: No Has patient had a PCN reaction that required hospitalization No Has patient had a PCN reaction occurring within the last 10 years: No If all of the above answers are "NO", then may proceed with Cephalosporin use.  . Sulfonamide Derivatives     unknown    Current Outpatient Medications on File Prior to Visit  Medication Sig Dispense Refill  .  calcium carbonate (OS-CAL - DOSED IN MG OF ELEMENTAL CALCIUM) 1250 (500 Ca) MG tablet Take 1 tablet by mouth. 600 mg daily.    . calcium carbonate (OSCAL) 1500 (600 Ca) MG TABS tablet Take by mouth 2 (two) times daily with a meal.    . donepezil (ARICEPT) 5 MG tablet Take 5 mg by mouth at bedtime.    . ferrous sulfate 325 (65 FE) MG tablet Take 325 mg by mouth daily with breakfast.    . sertraline (ZOLOFT) 100 MG tablet Take 100 mg by mouth daily.     . beta carotene w/minerals (OCUVITE) tablet Take 1 tablet by mouth daily.    . clotrimazole-betamethasone (LOTRISONE) cream Apply 1 application topically daily as needed (rash).     . EPINEPHrine 0.3 mg/0.3 mL IJ SOAJ injection Inject 0.3 mg into the skin as needed (bee stings).     . furosemide (LASIX) 20 MG tablet Take 20 mg by mouth daily as needed (swelling).     . Misc Natural Products (OSTEO BI-FLEX JOINT SHIELD) TABS Take 1 tablet by mouth daily.    . potassium chloride (K-DUR) 10 MEQ tablet Take 10 mEq by mouth daily as needed (When taking lasix).     . vitamin B-12 (CYANOCOBALAMIN) 1000 MCG tablet Take 1,000 mcg by mouth daily.     No current facility-administered medications on file prior to visit.  Objective:   Physical Exam Blood pressure 128/62, pulse 64, temperature 97.8 F (36.6 C), height 5\' 5"  (1.651 m), weight 188 lb 12.8 oz (85.6 kg). Alert and oriented. Skin warm and dry. Oral mucosa is moist.   . Sclera anicteric, conjunctivae is pink. Thyroid not enlarged. No cervical lymphadenopathy. Lungs clear. Heart regular rate and rhythm.  Abdomen is soft. Bowel sounds are positive. No hepatomegaly. No abdominal masses felt. No tenderness.  No edema to lower extremities. Stool brown and guaiac negative.         Assessment & Plan:  Anemia. Last colonoscopy in 2008. Will set her for a colonoscopy. Will get iron studies.

## 2017-06-10 NOTE — Patient Instructions (Signed)
The risks of bleeding, perforation and infection were reviewed with patient.  

## 2017-06-11 MED ORDER — PEG 3350-KCL-NA BICARB-NACL 420 G PO SOLR: 4000.0000 mL | Freq: Once | ORAL | 0 refills | Status: AC

## 2017-06-29 ENCOUNTER — Telehealth (INDEPENDENT_AMBULATORY_CARE_PROVIDER_SITE_OTHER): Payer: Self-pay | Admitting: Internal Medicine

## 2017-06-29 NOTE — Telephone Encounter (Signed)
Patient called stated she wants to cancel her procedure for Friday 07-03-17 - any questions please call her

## 2017-06-30 NOTE — Telephone Encounter (Signed)
I have called and LMOAM--asked her to call back about rescheduling

## 2017-07-03 ENCOUNTER — Ambulatory Visit (HOSPITAL_COMMUNITY): Admission: RE | Admit: 2017-07-03 | Payer: Medicare Other | Source: Ambulatory Visit | Admitting: Internal Medicine

## 2017-07-03 ENCOUNTER — Encounter (HOSPITAL_COMMUNITY): Admission: RE | Payer: Self-pay | Source: Ambulatory Visit

## 2017-07-03 SURGERY — COLONOSCOPY
Anesthesia: Moderate Sedation

## 2017-07-16 ENCOUNTER — Encounter: Payer: Self-pay | Admitting: Neurology

## 2017-07-16 ENCOUNTER — Ambulatory Visit (INDEPENDENT_AMBULATORY_CARE_PROVIDER_SITE_OTHER): Payer: Medicare Other | Admitting: Neurology

## 2017-07-16 VITALS — BP 120/66 | HR 69 | Ht 65.0 in | Wt 172.8 lb

## 2017-07-16 DIAGNOSIS — G3184 Mild cognitive impairment, so stated: Secondary | ICD-10-CM | POA: Diagnosis not present

## 2017-07-16 MED ORDER — DONEPEZIL HCL 10 MG PO TABS: 10.0000 mg | ORAL_TABLET | Freq: Every day | ORAL | 3 refills | Status: DC
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
## 2017-07-16 NOTE — Patient Instructions (Signed)
I had a long discussion with the patient regarding her memory loss and mild cognitive impairment which appears to be stable. She is tolerating Aricept well hence I recommend increasing dose to 10 mg daily. I also discussed memory compensation strategies and encouraged her to part sparing in cognitively challenging activities. Continue Plavix alone for stroke prevention. Maintain strict control of hypertension with blood pressure goal below 130/90 and lipids with LDL cholesterol goal below 70 mg percent.  She will return for follow-up in 6 months with my nurse practitioner Janett Billow or call earlier if necessary.

## 2017-08-11 DIAGNOSIS — E039 Hypothyroidism, unspecified: Secondary | ICD-10-CM | POA: Diagnosis not present

## 2017-08-20 DIAGNOSIS — E119 Type 2 diabetes mellitus without complications: Secondary | ICD-10-CM | POA: Diagnosis not present

## 2017-08-20 DIAGNOSIS — I639 Cerebral infarction, unspecified: Secondary | ICD-10-CM | POA: Diagnosis not present

## 2017-08-20 DIAGNOSIS — E7800 Pure hypercholesterolemia, unspecified: Secondary | ICD-10-CM | POA: Diagnosis not present

## 2017-09-08 DIAGNOSIS — I10 Essential (primary) hypertension: Secondary | ICD-10-CM | POA: Diagnosis not present

## 2017-09-08 DIAGNOSIS — E039 Hypothyroidism, unspecified: Secondary | ICD-10-CM | POA: Diagnosis not present

## 2017-09-08 DIAGNOSIS — F329 Major depressive disorder, single episode, unspecified: Secondary | ICD-10-CM | POA: Diagnosis not present

## 2017-09-08 DIAGNOSIS — K5730 Diverticulosis of large intestine without perforation or abscess without bleeding: Secondary | ICD-10-CM | POA: Diagnosis not present

## 2017-09-08 DIAGNOSIS — E1165 Type 2 diabetes mellitus with hyperglycemia: Secondary | ICD-10-CM | POA: Diagnosis not present

## 2017-09-08 DIAGNOSIS — E7800 Pure hypercholesterolemia, unspecified: Secondary | ICD-10-CM | POA: Diagnosis not present

## 2017-09-08 DIAGNOSIS — Z6833 Body mass index (BMI) 33.0-33.9, adult: Secondary | ICD-10-CM | POA: Diagnosis not present

## 2017-09-08 DIAGNOSIS — Z299 Encounter for prophylactic measures, unspecified: Secondary | ICD-10-CM | POA: Diagnosis not present

## 2017-10-05 DIAGNOSIS — I10 Essential (primary) hypertension: Secondary | ICD-10-CM | POA: Diagnosis not present

## 2017-10-05 DIAGNOSIS — Z299 Encounter for prophylactic measures, unspecified: Secondary | ICD-10-CM | POA: Diagnosis not present

## 2017-10-05 DIAGNOSIS — R29898 Other symptoms and signs involving the musculoskeletal system: Secondary | ICD-10-CM | POA: Diagnosis not present

## 2017-10-05 DIAGNOSIS — E1165 Type 2 diabetes mellitus with hyperglycemia: Secondary | ICD-10-CM | POA: Diagnosis not present

## 2017-10-05 DIAGNOSIS — L570 Actinic keratosis: Secondary | ICD-10-CM | POA: Diagnosis not present

## 2017-10-05 DIAGNOSIS — Z6833 Body mass index (BMI) 33.0-33.9, adult: Secondary | ICD-10-CM | POA: Diagnosis not present

## 2017-10-27 DIAGNOSIS — L821 Other seborrheic keratosis: Secondary | ICD-10-CM | POA: Diagnosis not present

## 2017-10-27 DIAGNOSIS — L819 Disorder of pigmentation, unspecified: Secondary | ICD-10-CM | POA: Diagnosis not present

## 2017-10-27 DIAGNOSIS — L570 Actinic keratosis: Secondary | ICD-10-CM | POA: Diagnosis not present

## 2017-10-29 ENCOUNTER — Telehealth: Payer: Self-pay | Admitting: Neurology

## 2017-10-29 NOTE — Telephone Encounter (Signed)
Pt has called asking that RN for Dr Leonie Man calls her.  Pt made aware his RN is not in office on today.  Pt states that his RN and Dr Leonie Man is who she has always delt with and that is who she would prefer to speak with.  Pt states if RN is unable to call her on tomorrow morning she will accept a call back on Monday morning.  Pt states that she has concerns that she only wishes to discuss with Dr Clydene Fake RN.  Please call

## 2017-10-30 NOTE — Telephone Encounter (Signed)
RN call patient back about her phone call on 10/29/2017. PT stated the purpose of the call was because she has a lot of questions about her health. Pt first ask about who she was seeing in 12/2017 for her follow up. Rn stated per Dr. Leonie Man last note she is seeing Jessica stroke NP. RN stated per Dr. Leonie Man he is seeing pts when he is here. RN ensured patient that Dr. Leonie Man and Janett Billow NP work together with the patient. Pt went on about she is afraid of having another stroke. Pt stated she has been drooling for the past month off and on.Pt was very concern about having another stroke but she was last seen 06/2017. Pt has not call the office at all about her drooling and her concerns. Rn could not get a clear story on what kind of drooling she had for the past month. Than patient went on about her leg not having good circulation or a good pulse. RN stated she would need to speak with the MD who told her about one of her legs not having a good pulse or circulation. PT stated she needs to have a thyroid ultrasound,and its schedule to be done in the office. Pt wanted to know if its safe to have a ultrasound in a office. Rn explain to patient a lot of doctors office do different scans, procedures, scans in the office,and this is common. PT than began to talk about being tired, and wanted to sleep all day. Rn stated per GI doctor in 06/2017 she was being treated with anemia,and needs to follow up with them with any issues or concerns. PT stated " im tired of seeing all these doctors all the time". Pt began to say she is seeing all of these doctors, and they dont tell her what's going on, or what's wrong with her. PT also began to discuss having a mammogram,and could they be done in [place]. Rn stated she will have to check with her PCP about that issue and scheduling.PT began to speak about Dr. Leonie Man and if he work with [facility]. Rn stated Dr Leonie Man only works for [facility] and is there every other week base on  his schedule. Rn stated he is in the office the other weeks. Pt stated she is taking her 5mg  aricept. Rn stated that per DR.SEthi told her to take 10mg  aricept daily. Pt stated the 10mg  was giving her problems. PT could not tell  me what side effects the 10mg  aricept was causing her. RN recommend pt continue the 10mg  aricept.as directed by Dr. Leonie Man. Rn stated if she knows of the side effects of 10mg  aricept to please call back so the MD can discuss the medication. Pt stated she does not drive anymore because of her stroke in 2016,and wanted to know of another neurologist in [place]. RN stated If she wants to establish care with another neurologist her records can be sent there. PT than stated" I will stay with DR. [place] office,  I was just asking."Pt verbalized understanding of everything that was discuss.

## 2017-11-02 DIAGNOSIS — R928 Other abnormal and inconclusive findings on diagnostic imaging of breast: Secondary | ICD-10-CM | POA: Diagnosis not present

## 2017-11-02 DIAGNOSIS — Z853 Personal history of malignant neoplasm of breast: Secondary | ICD-10-CM | POA: Diagnosis not present

## 2017-11-09 DIAGNOSIS — E034 Atrophy of thyroid (acquired): Secondary | ICD-10-CM | POA: Diagnosis not present

## 2017-11-09 DIAGNOSIS — E042 Nontoxic multinodular goiter: Secondary | ICD-10-CM | POA: Diagnosis not present

## 2017-12-15 DIAGNOSIS — I10 Essential (primary) hypertension: Secondary | ICD-10-CM | POA: Diagnosis not present

## 2017-12-15 DIAGNOSIS — C50919 Malignant neoplasm of unspecified site of unspecified female breast: Secondary | ICD-10-CM | POA: Diagnosis not present

## 2017-12-15 DIAGNOSIS — E1165 Type 2 diabetes mellitus with hyperglycemia: Secondary | ICD-10-CM | POA: Diagnosis not present

## 2017-12-15 DIAGNOSIS — Z6833 Body mass index (BMI) 33.0-33.9, adult: Secondary | ICD-10-CM | POA: Diagnosis not present

## 2017-12-15 DIAGNOSIS — Z299 Encounter for prophylactic measures, unspecified: Secondary | ICD-10-CM | POA: Diagnosis not present

## 2017-12-29 ENCOUNTER — Encounter: Payer: Self-pay | Admitting: *Deleted

## 2017-12-29 ENCOUNTER — Encounter: Payer: Self-pay | Admitting: Cardiovascular Disease

## 2017-12-29 ENCOUNTER — Ambulatory Visit (INDEPENDENT_AMBULATORY_CARE_PROVIDER_SITE_OTHER): Payer: Medicare Other | Admitting: Cardiovascular Disease

## 2017-12-29 VITALS — BP 130/50 | HR 74 | Ht 63.0 in | Wt 189.0 lb

## 2017-12-29 DIAGNOSIS — R0609 Other forms of dyspnea: Secondary | ICD-10-CM

## 2017-12-29 DIAGNOSIS — Q211 Atrial septal defect: Secondary | ICD-10-CM | POA: Diagnosis not present

## 2017-12-29 DIAGNOSIS — Z8673 Personal history of transient ischemic attack (TIA), and cerebral infarction without residual deficits: Secondary | ICD-10-CM

## 2017-12-29 DIAGNOSIS — Q2112 Patent foramen ovale: Secondary | ICD-10-CM

## 2017-12-29 NOTE — Patient Instructions (Signed)
Medication Instructions:  Continue all current medications.  Labwork: none  Testing/Procedures:  Your physician has requested that you have a lexiscan myoview. For further information please visit [email or web address]. Please follow instruction sheet, as given.  Your physician has requested that you have an echocardiogram (bubble study). Echocardiography is a painless test that uses sound waves to create images of your heart. It provides your doctor with information about the size and shape of your heart and how well your heart's chambers and valves are working. This procedure takes approximately one hour. There are no restrictions for this procedure.  Office will contact with results via phone or letter.     Follow-Up: 3 months   Any Other Special Instructions Will Be Listed Below (If Applicable).  If you need a refill on your cardiac medications before your next appointment, please call your pharmacy.

## 2017-12-29 NOTE — Progress Notes (Signed)
SUBJECTIVE: The patient presents to establish care with me in our [place] office.  This is my first time meeting her. She has a history of hypertension and stroke.  TEE on 05/28/2015 demonstrated normal left ventricular systolic function, LVEF greater than 55%, with mild pulmonic regurgitation.  There was a PFO with right-to-left flow.  There was no left atrial appendage thrombus.  She was to undergo an implantable loop recorder but it does not appear this was ever pursued.  Cardiac monitoring in May 2017 showed no significant arrhythmias.  Carotid Dopplers in November 2018 showed bilateral mild carotid artery stenosis, 1 to 39%.  She has been experiencing some exertional dyspnea for the past 2 months.  She feels fatigued after doing yard work.  She denies exertional chest pain and also denies palpitations.  She has been more off balance lately.  She is scheduled to see neurology on 01/14/2018.  She thinks she has been retaining fluid in her ankles due to eating excessive amounts of chips and popcorn.  She has also been eating a lot of ice cream.  She showed me a thyroid ultrasound report dated 11/09/2017 which showed multiple tiny thyroid nodules.  ECG performed in the office today which I ordered and personally interpreted demonstrates normal sinus rhythm with no ischemic ST segment or T-wave abnormalities, nor any arrhythmias.   Review of Systems: As per "subjective", otherwise negative.  Allergies  Allergen Reactions  . Bee Venom Swelling  . Penicillins Swelling    Has patient had a PCN reaction causing immediate rash, facial/tongue/throat swelling, SOB or lightheadedness with hypotension: No Has patient had a PCN reaction causing severe rash involving mucus membranes or skin necrosis: No Has patient had a PCN reaction that required hospitalization No Has patient had a PCN reaction occurring within the last 10 years: No If all of the above answers are "NO", then may proceed with  Cephalosporin use.  . Sulfonamide Derivatives Other (See Comments)    unknown    Current Outpatient Medications  Medication Sig Dispense Refill  . aspirin 81 MG chewable tablet Chew by mouth.    Marland Kitchen atorvastatin (LIPITOR) 10 MG tablet Take 10 mg by mouth daily.  12  . beta carotene w/minerals (OCUVITE) tablet Take 1 tablet by mouth daily.    . calcium carbonate (OS-CAL - DOSED IN MG OF ELEMENTAL CALCIUM) 1250 (500 Ca) MG tablet Take 1 tablet by mouth. 600 mg daily.    . clopidogrel (PLAVIX) 75 MG tablet Take 75 mg by mouth daily.  12  . donepezil (ARICEPT) 10 MG tablet Take 1 tablet (10 mg total) by mouth at bedtime. 30 tablet 3  . EPINEPHrine 0.3 mg/0.3 mL IJ SOAJ injection Inject 0.3 mg into the skin as needed (bee stings).     . ferrous sulfate 325 (65 FE) MG tablet Take 325 mg by mouth daily with breakfast.    . levothyroxine (SYNTHROID, LEVOTHROID) 88 MCG tablet Take 112 mcg by mouth.     . Misc Natural Products (OSTEO BI-FLEX JOINT SHIELD PO) Take by mouth.    . sertraline (ZOLOFT) 100 MG tablet Take 100 mg by mouth daily.     . Sucralfate (CARAFATE PO) Take 600 mg by mouth.    . vitamin B-12 (CYANOCOBALAMIN) 1000 MCG tablet Take 1,000 mcg by mouth daily.     No current facility-administered medications for this visit.     Past Medical History:  Diagnosis Date  . Esophageal spasm    2011-  Dr Delfin Edis  . Hypertension    just started taking BP meds  . Hypothyroidism   . Stroke ([place])   . Vision abnormalities     Past Surgical History:  Procedure Laterality Date  . BREAST SURGERY  11/13/11   Lt parial mastectomy  . left knee surgery    . TEE WITHOUT CARDIOVERSION N/A 05/28/2015   Procedure: TRANSESOPHAGEAL ECHOCARDIOGRAM (TEE);  Surgeon: Skeet Latch, MD;  Location: [facility] ENDOSCOPY;  Service: Cardiovascular;  Laterality: N/A;    Social History   Socioeconomic History  . Marital status: Widowed    Spouse name: Not on file  . Number of children: Not on file  . Years  of education: Not on file  . Highest education level: Not on file  Occupational History  . Not on file  Social Needs  . Financial resource strain: Not on file  . Food insecurity:    Worry: Not on file    Inability: Not on file  . Transportation needs:    Medical: Not on file    Non-medical: Not on file  Tobacco Use  . Smoking status: Never Smoker  . Smokeless tobacco: Never Used  Substance and Sexual Activity  . Alcohol use: No  . Drug use: No  . Sexual activity: Not on file  Lifestyle  . Physical activity:    Days per week: Not on file    Minutes per session: Not on file  . Stress: Not on file  Relationships  . Social connections:    Talks on phone: Not on file    Gets together: Not on file    Attends religious service: Not on file    Active member of club or organization: Not on file    Attends meetings of clubs or organizations: Not on file    Relationship status: Not on file  . Intimate partner violence:    Fear of current or ex partner: Not on file    Emotionally abused: Not on file    Physically abused: Not on file    Forced sexual activity: Not on file  Other Topics Concern  . Not on file  Social History Narrative  . Not on file     Vitals:   12/29/17 1423  BP: (!) 130/50  Pulse: 74  SpO2: 97%  Weight: 189 lb (85.7 kg)  Height: 5\' 3"  (1.6 m)    Wt Readings from Last 3 Encounters:  12/29/17 189 lb (85.7 kg)  07/16/17 172 lb 12.8 oz (78.4 kg)  06/10/17 188 lb 12.8 oz (85.6 kg)     PHYSICAL EXAM General: NAD HEENT: Normal. Neck: No JVD, no thyromegaly. Lungs: Clear to auscultation bilaterally with normal respiratory effort. CV: Regular rate and rhythm, normal S1/S2, no S3/S4, no murmur. No pretibial or periankle edema.  No carotid bruit.   Abdomen: Soft, nontender, no distention.  Neurologic: Alert and oriented.  Psych: Normal affect. Skin: Normal. Musculoskeletal: No gross deformities.    ECG: Reviewed above under  Subjective   Labs: Lab Results  Component Value Date/Time   K 3.8 11/10/2011 11:30 AM   BUN 11 11/10/2011 11:30 AM   CREATININE 0.54 11/10/2011 11:30 AM   ALT 28 11/10/2011 11:30 AM   HGB 11.4 (L) 11/10/2011 11:30 AM     Lipids: No results found for: LDLCALC, LDLDIRECT, CHOL, TRIG, HDL     ASSESSMENT AND PLAN: 1.  Exertional dyspnea: Unclear etiology.  This may be due to deconditioning and obesity.  She does have a  PFO.  I will obtain a follow-up echocardiogram to reassess cardiac structure and function and also obtain a bubble study. ECG is normal. I will proceed with a nuclear myocardial perfusion imaging study to evaluate for ischemic heart disease (Lexiscan Myoview).  2.  History of stroke: Currently on aspirin, atorvastatin, and Plavix.  3. PFO:  I will obtain a follow-up echocardiogram to reassess cardiac structure and function and also obtain a bubble study.     Disposition: Follow up 2 months  Time spent: 40 minutes, of which greater than 50% was spent reviewing symptoms, relevant blood tests and studies, and discussing management plan with the patient.    Kate Sable, M.D., F.A.C.C.

## 2017-12-30 ENCOUNTER — Telehealth: Payer: Self-pay | Admitting: Cardiovascular Disease

## 2017-12-30 NOTE — Telephone Encounter (Signed)
Pre-cert Verification for the following procedure   Lexiscan  & Echo with bubble study scheduled for 01-05-18 at [facility]

## 2018-01-05 ENCOUNTER — Telehealth: Payer: Self-pay | Admitting: *Deleted

## 2018-01-05 ENCOUNTER — Encounter (HOSPITAL_BASED_OUTPATIENT_CLINIC_OR_DEPARTMENT_OTHER)
Admission: RE | Admit: 2018-01-05 | Discharge: 2018-01-05 | Disposition: A | Discharge status: Home / Self Care | Payer: Medicare Other | Source: Ambulatory Visit | Attending: Cardiovascular Disease | Admitting: Cardiovascular Disease

## 2018-01-05 ENCOUNTER — Encounter (HOSPITAL_COMMUNITY): Payer: Self-pay

## 2018-01-05 ENCOUNTER — Encounter (HOSPITAL_COMMUNITY)
Admission: RE | Admit: 2018-01-05 | Discharge: 2018-01-05 | Disposition: A | Discharge status: Home / Self Care | Payer: Medicare Other | Source: Ambulatory Visit | Attending: Cardiovascular Disease | Admitting: Cardiovascular Disease

## 2018-01-05 ENCOUNTER — Ambulatory Visit (HOSPITAL_COMMUNITY)
Admission: RE | Admit: 2018-01-05 | Discharge: 2018-01-05 | Disposition: A | Discharge status: Home / Self Care | Payer: Medicare Other | Source: Ambulatory Visit | Attending: Cardiovascular Disease | Admitting: Cardiovascular Disease

## 2018-01-05 DIAGNOSIS — E7800 Pure hypercholesterolemia, unspecified: Secondary | ICD-10-CM | POA: Diagnosis not present

## 2018-01-05 DIAGNOSIS — R0609 Other forms of dyspnea: Secondary | ICD-10-CM | POA: Insufficient documentation

## 2018-01-05 DIAGNOSIS — Z8673 Personal history of transient ischemic attack (TIA), and cerebral infarction without residual deficits: Secondary | ICD-10-CM | POA: Insufficient documentation

## 2018-01-05 DIAGNOSIS — Q2112 Patent foramen ovale: Secondary | ICD-10-CM

## 2018-01-05 DIAGNOSIS — E119 Type 2 diabetes mellitus without complications: Secondary | ICD-10-CM | POA: Insufficient documentation

## 2018-01-05 DIAGNOSIS — Z853 Personal history of malignant neoplasm of breast: Secondary | ICD-10-CM | POA: Diagnosis not present

## 2018-01-05 DIAGNOSIS — Q211 Atrial septal defect: Secondary | ICD-10-CM | POA: Insufficient documentation

## 2018-01-05 LAB — NM MYOCAR MULTI W/SPECT W/WALL MOTION / EF
CHL CUP NUCLEAR SRS: 1
CHL CUP NUCLEAR SSS: 3
CSEPPHR: 81 {beats}/min
LV dias vol: 66 mL (ref 46–106)
LV sys vol: 32 mL
RATE: 0.42
Rest HR: 56 {beats}/min
SDS: 2
TID: 1.49

## 2018-01-05 IMAGING — NM NM MYOCAR MULTI W/SPECT W/WALL MOTION & EF
2 series · 12 of 12 positions shown · non-contrast
Comparison: none

[Series 1: rest · 6.51mm/px · 6 of 64 frames shown]
[frame 6/64]
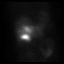
[frame 16/64]
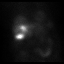
[frame 27/64]
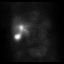
[frame 38/64]
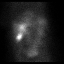
[frame 48/64]
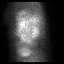
[frame 59/64]
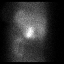

[Series 3: stress gated - perfusion · 6.51mm/px · 6 of 64 frames shown]
[frame 6/64]
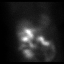
[frame 16/64]
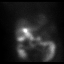
[frame 27/64]
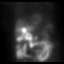
[frame 38/64]
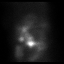
[frame 48/64]
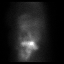
[frame 59/64]
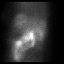

[12 of 12 positions shown; findings below may reference images not displayed]

Canned report from images found in remote index.

Refer to host system for actual result text.

## 2018-01-05 MED ORDER — REGADENOSON 0.4 MG/5ML IV SOLN
INTRAVENOUS | Status: AC
  Administered 2018-01-05: 0.4 mg via INTRAVENOUS
  Filled 2018-01-05: qty 5

## 2018-01-05 MED ORDER — SODIUM CHLORIDE 0.9% FLUSH
INTRAVENOUS | Status: AC
  Administered 2018-01-05: 10 mL via INTRAVENOUS
  Filled 2018-01-05: qty 10

## 2018-01-05 MED ORDER — TECHNETIUM TC 99M TETROFOSMIN IV KIT
10.0000 | PACK | Freq: Once | INTRAVENOUS | Status: AC | PRN
  Administered 2018-01-05: 10.86 via INTRAVENOUS

## 2018-01-05 MED ORDER — TECHNETIUM TC 99M TETROFOSMIN IV KIT
30.0000 | PACK | Freq: Once | INTRAVENOUS | Status: AC | PRN
  Administered 2018-01-05: 31 via INTRAVENOUS

## 2018-01-05 NOTE — Telephone Encounter (Signed)
ECHO -  Notes recorded by Herminio Commons, MD on 01/05/2018 at 2:59 PM EDT This study demonstrates: Normal stress test. No evidence for blockages. Medication changes / Follow up studies / Other recommendations:  None. Please send results to the PCP: Glenda Chroman, MD  Kate Sable, MD 01/05/2018 2:58 PM   STRESS TEST -   Notes recorded by Herminio Commons, MD on 01/05/2018 at 2:59 PM EDT This study demonstrates: Normal stress test. No evidence for blockages. Medication changes / Follow up studies / Other recommendations:  None. Please send results to the PCP: Glenda Chroman, MD  Kate Sable, MD 01/05/2018 2:58 PM

## 2018-01-05 NOTE — Telephone Encounter (Signed)
Notes recorded by Laurine Blazer, LPN on 67/56/1254 at 4:10 PM EDT Patient notified. Copy to pmd. Follow up scheduled for 04/13/18 with Dr. Bronson Ing in [place].

## 2018-01-05 NOTE — Progress Notes (Signed)
*  PRELIMINARY RESULTS* Echocardiogram 2D Echocardiogram has been performed with saline contrast.  Samuel Germany 01/05/2018, 2:04 PM

## 2018-01-14 ENCOUNTER — Ambulatory Visit: Payer: Medicare Other | Admitting: Adult Health

## 2018-02-26 DIAGNOSIS — Z23 Encounter for immunization: Secondary | ICD-10-CM | POA: Diagnosis not present

## 2018-03-19 DIAGNOSIS — Z6833 Body mass index (BMI) 33.0-33.9, adult: Secondary | ICD-10-CM | POA: Diagnosis not present

## 2018-03-19 DIAGNOSIS — E559 Vitamin D deficiency, unspecified: Secondary | ICD-10-CM | POA: Diagnosis not present

## 2018-03-19 DIAGNOSIS — R5383 Other fatigue: Secondary | ICD-10-CM | POA: Diagnosis not present

## 2018-03-19 DIAGNOSIS — I10 Essential (primary) hypertension: Secondary | ICD-10-CM | POA: Diagnosis not present

## 2018-03-19 DIAGNOSIS — Z299 Encounter for prophylactic measures, unspecified: Secondary | ICD-10-CM | POA: Diagnosis not present

## 2018-03-22 DIAGNOSIS — Z888 Allergy status to other drugs, medicaments and biological substances status: Secondary | ICD-10-CM | POA: Diagnosis not present

## 2018-03-22 DIAGNOSIS — R531 Weakness: Secondary | ICD-10-CM | POA: Diagnosis not present

## 2018-03-22 DIAGNOSIS — E039 Hypothyroidism, unspecified: Secondary | ICD-10-CM | POA: Diagnosis not present

## 2018-03-22 DIAGNOSIS — Z880 Allergy status to penicillin: Secondary | ICD-10-CM | POA: Diagnosis not present

## 2018-03-22 DIAGNOSIS — E785 Hyperlipidemia, unspecified: Secondary | ICD-10-CM | POA: Diagnosis not present

## 2018-03-22 DIAGNOSIS — F329 Major depressive disorder, single episode, unspecified: Secondary | ICD-10-CM | POA: Diagnosis not present

## 2018-03-22 DIAGNOSIS — D649 Anemia, unspecified: Secondary | ICD-10-CM | POA: Diagnosis not present

## 2018-03-22 DIAGNOSIS — M1990 Unspecified osteoarthritis, unspecified site: Secondary | ICD-10-CM | POA: Diagnosis not present

## 2018-03-22 DIAGNOSIS — E559 Vitamin D deficiency, unspecified: Secondary | ICD-10-CM | POA: Diagnosis not present

## 2018-03-22 DIAGNOSIS — Z853 Personal history of malignant neoplasm of breast: Secondary | ICD-10-CM | POA: Diagnosis not present

## 2018-03-22 DIAGNOSIS — Z8673 Personal history of transient ischemic attack (TIA), and cerebral infarction without residual deficits: Secondary | ICD-10-CM | POA: Diagnosis not present

## 2018-03-22 DIAGNOSIS — Z883 Allergy status to other anti-infective agents status: Secondary | ICD-10-CM | POA: Diagnosis not present

## 2018-03-22 DIAGNOSIS — E7800 Pure hypercholesterolemia, unspecified: Secondary | ICD-10-CM | POA: Diagnosis not present

## 2018-03-22 DIAGNOSIS — E538 Deficiency of other specified B group vitamins: Secondary | ICD-10-CM | POA: Diagnosis not present

## 2018-03-22 DIAGNOSIS — I10 Essential (primary) hypertension: Secondary | ICD-10-CM | POA: Diagnosis not present

## 2018-03-22 DIAGNOSIS — R413 Other amnesia: Secondary | ICD-10-CM | POA: Diagnosis not present

## 2018-03-22 DIAGNOSIS — M810 Age-related osteoporosis without current pathological fracture: Secondary | ICD-10-CM | POA: Diagnosis not present

## 2018-03-23 DIAGNOSIS — Z8673 Personal history of transient ischemic attack (TIA), and cerebral infarction without residual deficits: Secondary | ICD-10-CM | POA: Diagnosis not present

## 2018-03-23 DIAGNOSIS — D649 Anemia, unspecified: Secondary | ICD-10-CM | POA: Diagnosis not present

## 2018-03-23 DIAGNOSIS — R531 Weakness: Secondary | ICD-10-CM | POA: Diagnosis not present

## 2018-03-23 DIAGNOSIS — I10 Essential (primary) hypertension: Secondary | ICD-10-CM | POA: Diagnosis not present

## 2018-03-24 DIAGNOSIS — R413 Other amnesia: Secondary | ICD-10-CM | POA: Diagnosis not present

## 2018-03-24 DIAGNOSIS — D649 Anemia, unspecified: Secondary | ICD-10-CM | POA: Diagnosis not present

## 2018-03-24 DIAGNOSIS — Z8673 Personal history of transient ischemic attack (TIA), and cerebral infarction without residual deficits: Secondary | ICD-10-CM | POA: Diagnosis not present

## 2018-03-24 DIAGNOSIS — E039 Hypothyroidism, unspecified: Secondary | ICD-10-CM | POA: Diagnosis not present

## 2018-03-24 DIAGNOSIS — E538 Deficiency of other specified B group vitamins: Secondary | ICD-10-CM | POA: Diagnosis not present

## 2018-03-24 DIAGNOSIS — F329 Major depressive disorder, single episode, unspecified: Secondary | ICD-10-CM | POA: Diagnosis not present

## 2018-03-24 DIAGNOSIS — M810 Age-related osteoporosis without current pathological fracture: Secondary | ICD-10-CM | POA: Diagnosis not present

## 2018-03-24 DIAGNOSIS — R531 Weakness: Secondary | ICD-10-CM | POA: Diagnosis not present

## 2018-03-24 DIAGNOSIS — M1990 Unspecified osteoarthritis, unspecified site: Secondary | ICD-10-CM | POA: Diagnosis not present

## 2018-03-24 DIAGNOSIS — I10 Essential (primary) hypertension: Secondary | ICD-10-CM | POA: Diagnosis not present

## 2018-03-24 DIAGNOSIS — Z883 Allergy status to other anti-infective agents status: Secondary | ICD-10-CM | POA: Diagnosis not present

## 2018-03-24 DIAGNOSIS — E7800 Pure hypercholesterolemia, unspecified: Secondary | ICD-10-CM | POA: Diagnosis not present

## 2018-03-24 DIAGNOSIS — E559 Vitamin D deficiency, unspecified: Secondary | ICD-10-CM | POA: Diagnosis not present

## 2018-03-24 DIAGNOSIS — Z853 Personal history of malignant neoplasm of breast: Secondary | ICD-10-CM | POA: Diagnosis not present

## 2018-03-24 DIAGNOSIS — E785 Hyperlipidemia, unspecified: Secondary | ICD-10-CM | POA: Diagnosis not present

## 2018-03-24 DIAGNOSIS — Z888 Allergy status to other drugs, medicaments and biological substances status: Secondary | ICD-10-CM | POA: Diagnosis not present

## 2018-03-24 DIAGNOSIS — Z880 Allergy status to penicillin: Secondary | ICD-10-CM | POA: Diagnosis not present

## 2018-03-31 ENCOUNTER — Encounter: Payer: Self-pay | Admitting: Internal Medicine

## 2018-03-31 DIAGNOSIS — E1165 Type 2 diabetes mellitus with hyperglycemia: Secondary | ICD-10-CM | POA: Diagnosis not present

## 2018-03-31 DIAGNOSIS — I10 Essential (primary) hypertension: Secondary | ICD-10-CM | POA: Diagnosis not present

## 2018-03-31 DIAGNOSIS — D509 Iron deficiency anemia, unspecified: Secondary | ICD-10-CM | POA: Diagnosis not present

## 2018-03-31 DIAGNOSIS — C50919 Malignant neoplasm of unspecified site of unspecified female breast: Secondary | ICD-10-CM | POA: Diagnosis not present

## 2018-03-31 DIAGNOSIS — K279 Peptic ulcer, site unspecified, unspecified as acute or chronic, without hemorrhage or perforation: Secondary | ICD-10-CM | POA: Diagnosis not present

## 2018-03-31 DIAGNOSIS — Z6832 Body mass index (BMI) 32.0-32.9, adult: Secondary | ICD-10-CM | POA: Diagnosis not present

## 2018-03-31 DIAGNOSIS — Z299 Encounter for prophylactic measures, unspecified: Secondary | ICD-10-CM | POA: Diagnosis not present

## 2018-04-08 ENCOUNTER — Encounter (INDEPENDENT_AMBULATORY_CARE_PROVIDER_SITE_OTHER): Payer: Self-pay | Admitting: Internal Medicine

## 2018-04-08 ENCOUNTER — Other Ambulatory Visit (INDEPENDENT_AMBULATORY_CARE_PROVIDER_SITE_OTHER): Payer: Self-pay | Admitting: Internal Medicine

## 2018-04-08 ENCOUNTER — Encounter (INDEPENDENT_AMBULATORY_CARE_PROVIDER_SITE_OTHER): Payer: Self-pay | Admitting: *Deleted

## 2018-04-08 ENCOUNTER — Ambulatory Visit (INDEPENDENT_AMBULATORY_CARE_PROVIDER_SITE_OTHER): Payer: Medicare Other | Admitting: Internal Medicine

## 2018-04-08 VITALS — BP 148/66 | HR 69 | Temp 98.1°F | Ht 62.0 in | Wt 179.5 lb

## 2018-04-08 DIAGNOSIS — K921 Melena: Secondary | ICD-10-CM

## 2018-04-08 LAB — HEMOGLOBIN AND HEMATOCRIT, BLOOD
HEMATOCRIT: 34.9 % — AB (ref 35.0–45.0)
HEMOGLOBIN: 11.4 g/dL — AB (ref 11.7–15.5)

## 2018-04-08 NOTE — Patient Instructions (Signed)
EGD. The risks of bleeding, perforation and infection were reviewed with patient.  

## 2018-04-08 NOTE — Progress Notes (Signed)
Subjective:    Patient ID: Crystal Rodriguez, female    DOB: 24-Apr-1932, 85 y.o.   MRN: [id number]  HPIReferred by Dr Woody Seller for GI bleed. Recently admitted to [facility] for GI bleed. Sent to the ED by Dr. Woody Seller on 03/22/2018 for hemoglobin of 6.6  Apparently had melena. Had melena for months.  She states he stools are brown.  She received 4 units of PRBC. Started on Protonix 40mg  at discharge. Last seen by me in March of this for anemia/colonoscopy. (She did not have lab work drawn that was ordered by me.).  Plavix is on hold.   03/31/2017 Hand H 11.7 and 37.5.  03/22/2018 Hemoglobin 6.6   Hx of breast cancer Diabetes, CVA maintained on Plavix, high cholesterol, hypothyroid.  Family hx of Crohn's disease. No family hx of colon cancer. Her last colonoscopy was in 2008  DR. Brody (Screening) Diverticulosis in descending to sigmoid cold. Mild sigmoid diverticulosis.  Review of Systems Past Medical History:  Diagnosis Date  . Esophageal spasm    2011- Dr Delfin Edis  . Hypertension    just started taking BP meds  . Hypothyroidism   . Stroke ([place])   . Vision abnormalities     Past Surgical History:  Procedure Laterality Date  . BREAST SURGERY  11/13/11   Lt parial mastectomy  . left knee surgery    . TEE WITHOUT CARDIOVERSION N/A 05/28/2015   Procedure: TRANSESOPHAGEAL ECHOCARDIOGRAM (TEE);  Surgeon: Skeet Latch, MD;  Location: [facility] ENDOSCOPY;  Service: Cardiovascular;  Laterality: N/A;    Allergies  Allergen Reactions  . Bee Venom Swelling  . Penicillins Swelling    Has patient had a PCN reaction causing immediate rash, facial/tongue/throat swelling, SOB or lightheadedness with hypotension: No Has patient had a PCN reaction causing severe rash involving mucus membranes or skin necrosis: No Has patient had a PCN reaction that required hospitalization No Has patient had a PCN reaction occurring within the last 10 years: No If all of the above answers are "NO", then may proceed with  Cephalosporin use.  . Sulfonamide Derivatives Other (See Comments)    unknown    Current Outpatient Medications on File Prior to Visit  Medication Sig Dispense Refill  . aspirin 81 MG chewable tablet Chew by mouth.    Marland Kitchen atorvastatin (LIPITOR) 10 MG tablet Take 10 mg by mouth daily.  12  . Calcium Carbonate-Vit D-Min (CALTRATE 600+D PLUS MINERALS PO) Take by mouth.    . clopidogrel (PLAVIX) 75 MG tablet Take 75 mg by mouth daily.  12  . donepezil (ARICEPT) 10 MG tablet Take 1 tablet (10 mg total) by mouth at bedtime. 30 tablet 3  . EPINEPHrine 0.3 mg/0.3 mL IJ SOAJ injection Inject 0.3 mg into the skin as needed (bee stings).     Marland Kitchen levothyroxine (SYNTHROID, LEVOTHROID) 88 MCG tablet Take 112 mcg by mouth.     . pantoprazole (PROTONIX) 40 MG tablet Take 40 mg by mouth daily.    . sertraline (ZOLOFT) 100 MG tablet Take 100 mg by mouth daily.      No current facility-administered medications on file prior to visit.         Objective:   Physical Exam Blood pressure (!) 148/66, pulse 69, temperature 98.1 F (36.7 C), height 5\' 2"  (1.575 m), weight 179 lb 8 oz (81.4 kg). Alert and oriented. Skin warm and dry. Oral mucosa is moist.   . Sclera anicteric, conjunctivae is pink. Thyroid not enlarged. No cervical lymphadenopathy. Lungs  clear. Heart regular rate and rhythm.  Abdomen is soft. Bowel sounds are positive. No hepatomegaly. No abdominal masses felt. No tenderness.  No edema to lower extremities.          Assessment & Plan:  Melena. Needs EGD with propofol to rule out PUD.

## 2018-04-12 ENCOUNTER — Telehealth (INDEPENDENT_AMBULATORY_CARE_PROVIDER_SITE_OTHER): Payer: Self-pay | Admitting: *Deleted

## 2018-04-12 NOTE — Telephone Encounter (Signed)
Patient wants result of blood work -- please call [phone]

## 2018-04-13 ENCOUNTER — Encounter: Payer: Self-pay | Admitting: Cardiovascular Disease

## 2018-04-13 ENCOUNTER — Ambulatory Visit (INDEPENDENT_AMBULATORY_CARE_PROVIDER_SITE_OTHER): Payer: Medicare Other | Admitting: Cardiovascular Disease

## 2018-04-13 VITALS — BP 156/73 | HR 58 | Ht 63.0 in | Wt 180.8 lb

## 2018-04-13 DIAGNOSIS — D62 Acute posthemorrhagic anemia: Secondary | ICD-10-CM

## 2018-04-13 DIAGNOSIS — Z8673 Personal history of transient ischemic attack (TIA), and cerebral infarction without residual deficits: Secondary | ICD-10-CM | POA: Diagnosis not present

## 2018-04-13 DIAGNOSIS — R0609 Other forms of dyspnea: Secondary | ICD-10-CM | POA: Diagnosis not present

## 2018-04-13 DIAGNOSIS — R030 Elevated blood-pressure reading, without diagnosis of hypertension: Secondary | ICD-10-CM

## 2018-04-13 NOTE — Patient Instructions (Signed)
Your physician recommends that you schedule a follow-up appointment in: AS NEEDED WITH DR KONESWARAN  Your physician recommends that you continue on your current medications as directed. Please refer to the Current Medication list given to you today.  Thank you for choosing [facility]!!    

## 2018-04-13 NOTE — Telephone Encounter (Signed)
No answer. Results left on answering machine.

## 2018-04-13 NOTE — Progress Notes (Signed)
SUBJECTIVE: The patient returns for follow-up after undergoing cardiovascular testing performed for the evaluation of exertional dyspnea.  Nuclear stress test on 01/05/2018 was normal.  Echocardiogram on 01/05/2018 demonstrated normal left ventricular systolic function and regional wall motion, LVEF 60 to 88%, grade 1 diastolic dysfunction with high ventricular filling pressures, aortic annular calcification and mildly thickened aortic and mitral leaflets, mild left atrial dilatation, and no evidence of right to left atrial shunt with contrast.  She told me that she was hospitalized in late December for severe anemia with a hemoglobin of 6.6.  She had to be transfused 4 units of packed red blood cells.  She had been experiencing melena and has been craving ice.  She is scheduled to undergo an EGD on 04/16/2018.  She is feeling much better and energy levels have significantly improved.  She denies chest pain, palpitations, and shortness of breath.      Review of Systems: As per "subjective", otherwise negative.  Allergies  Allergen Reactions  . Bee Venom Swelling  . Penicillins Swelling and Other (See Comments)    Has patient had a PCN reaction causing immediate rash, facial/tongue/throat swelling, SOB or lightheadedness with hypotension: No Has patient had a PCN reaction causing severe rash involving mucus membranes or skin necrosis: No Has patient had a PCN reaction that required hospitalization No Has patient had a PCN reaction occurring within the last 10 years: No If all of the above answers are "NO", then may proceed with Cephalosporin use.  . Sulfonamide Derivatives Other (See Comments)    unknown    Current Outpatient Medications  Medication Sig Dispense Refill  . aspirin 81 MG tablet Take 81 mg by mouth daily.     Marland Kitchen atorvastatin (LIPITOR) 10 MG tablet Take 5 mg by mouth daily.   12  . Calcium Carbonate-Vit D-Min (CALTRATE 600+D PLUS MINERALS PO) Take 1 tablet by  mouth daily.     . clopidogrel (PLAVIX) 75 MG tablet Take 75 mg by mouth daily.  12  . donepezil (ARICEPT) 5 MG tablet Take 5 mg by mouth daily.    Marland Kitchen EPINEPHrine 0.3 mg/0.3 mL IJ SOAJ injection Inject 0.3 mg into the muscle as needed (for bee stings).     Marland Kitchen levothyroxine (SYNTHROID, LEVOTHROID) 112 MCG tablet Take 112 mcg by mouth daily before breakfast.     . pantoprazole (PROTONIX) 40 MG tablet Take 40 mg by mouth daily.    . sertraline (ZOLOFT) 100 MG tablet Take 100 mg by mouth daily.      No current facility-administered medications for this visit.     Past Medical History:  Diagnosis Date  . Esophageal spasm    2011- Dr Delfin Edis  . Hypertension    just started taking BP meds  . Hypothyroidism   . Stroke ([place])   . Vision abnormalities     Past Surgical History:  Procedure Laterality Date  . BREAST SURGERY  11/13/11   Lt parial mastectomy  . left knee surgery    . TEE WITHOUT CARDIOVERSION N/A 05/28/2015   Procedure: TRANSESOPHAGEAL ECHOCARDIOGRAM (TEE);  Surgeon: Skeet Latch, MD;  Location: [facility] ENDOSCOPY;  Service: Cardiovascular;  Laterality: N/A;    Social History   Socioeconomic History  . Marital status: Widowed    Spouse name: Not on file  . Number of children: Not on file  . Years of education: Not on file  . Highest education level: Not on file  Occupational History  . Not on  file  Social Needs  . Financial resource strain: Not on file  . Food insecurity:    Worry: Not on file    Inability: Not on file  . Transportation needs:    Medical: Not on file    Non-medical: Not on file  Tobacco Use  . Smoking status: Never Smoker  . Smokeless tobacco: Never Used  Substance and Sexual Activity  . Alcohol use: No  . Drug use: No  . Sexual activity: Not on file  Lifestyle  . Physical activity:    Days per week: Not on file    Minutes per session: Not on file  . Stress: Not on file  Relationships  . Social connections:    Talks on phone: Not on file      Gets together: Not on file    Attends religious service: Not on file    Active member of club or organization: Not on file    Attends meetings of clubs or organizations: Not on file    Relationship status: Not on file  . Intimate partner violence:    Fear of current or ex partner: Not on file    Emotionally abused: Not on file    Physically abused: Not on file    Forced sexual activity: Not on file  Other Topics Concern  . Not on file  Social History Narrative  . Not on file     Vitals:   04/13/18 1057  BP: (!) 156/73  Pulse: (!) 58  SpO2: 99%  Weight: 180 lb 12.8 oz (82 kg)  Height: 5\' 3"  (1.6 m)    Wt Readings from Last 3 Encounters:  04/13/18 180 lb 12.8 oz (82 kg)  04/08/18 179 lb 8 oz (81.4 kg)  12/29/17 189 lb (85.7 kg)     PHYSICAL EXAM General: NAD HEENT: Normal. Neck: No JVD, no thyromegaly. Lungs: Clear to auscultation bilaterally with normal respiratory effort. CV: Regular rate and rhythm, normal S1/S2, no S3/S4, no murmur. No pretibial or periankle edema.  Abdomen: Soft, nontender, no distention.  Neurologic: Alert and oriented.  Psych: Normal affect. Skin: Normal. Musculoskeletal: No gross deformities.    ECG: Reviewed above under Subjective   Labs: Lab Results  Component Value Date/Time   K 3.8 11/10/2011 11:30 AM   BUN 11 11/10/2011 11:30 AM   CREATININE 0.54 11/10/2011 11:30 AM   ALT 28 11/10/2011 11:30 AM   HGB 11.4 (L) 04/08/2018 03:34 PM     Lipids: No results found for: LDLCALC, LDLDIRECT, CHOL, TRIG, HDL     ASSESSMENT AND PLAN: 1.  Exertional dyspnea: This was likely due to severe transfusion dependent anemia.  Symptoms have markedly improved.  Stress test was normal as detailed above.  2.  History of stroke: Currently off of aspirin and Plavix due to upcoming EGD.  She is on statin therapy.  3. PFO:   No evidence of interatrial shunt with contrast study by echocardiogram as detailed above.  4.  Elevated blood  pressure: This will need further monitoring.    Disposition: Follow up as needed   Kate Sable, M.D., F.A.C.C.

## 2018-04-13 NOTE — Patient Instructions (Signed)
Crystal Rodriguez  04/13/2018     @PREFPERIOPPHARMACY @   Your procedure is scheduled on 04/16/2018.  Report to [facility] at 1200 A.M.  Call this number if you have problems the morning of surgery:  [phone]   Remember:  Do not eat or drink after midnight.      Take these medicines the morning of surgery with A SIP OF WATER Aricept, Synthroid, Protonix, Zoloft    Do not wear jewelry, make-up or nail polish.  Do not wear lotions, powders, or perfumes, or deodorant.  Do not shave 48 hours prior to surgery.  Men may shave face and neck.  Do not bring valuables to the hospital.  [facility] is not responsible for any belongings or valuables.  Contacts, dentures or bridgework may not be worn into surgery.  Leave your suitcase in the car.  After surgery it may be brought to your room.  For patients admitted to the hospital, discharge time will be determined by your treatment team.  Patients discharged the day of surgery will not be allowed to drive home.    Please read over the following fact sheets that you were given. Anesthesia Post-op Instructions     PATIENT INSTRUCTIONS POST-ANESTHESIA  IMMEDIATELY FOLLOWING SURGERY:  Do not drive or operate machinery for the first twenty four hours after surgery.  Do not make any important decisions for twenty four hours after surgery or while taking narcotic pain medications or sedatives.  If you develop intractable nausea and vomiting or a severe headache please notify your doctor immediately.  FOLLOW-UP:  Please make an appointment with your surgeon as instructed. You do not need to follow up with anesthesia unless specifically instructed to do so.  WOUND CARE INSTRUCTIONS (if applicable):  Keep a dry clean dressing on the anesthesia/puncture wound site if there is drainage.  Once the wound has quit draining you may leave it open to air.  Generally you should leave the bandage intact for twenty four hours unless there is drainage.   If the epidural site drains for more than 36-48 hours please call the anesthesia department.  QUESTIONS?:  Please feel free to call your physician or the hospital operator if you have any questions, and they will be happy to assist you.      Upper Endoscopy, Adult Upper endoscopy is a procedure to look inside the upper GI (gastrointestinal) tract. The upper GI tract is made up of:  The part of the body that moves food from your mouth to your stomach (esophagus).  The stomach.  The first part of your small intestine (duodenum). This procedure is also called esophagogastroduodenoscopy (EGD) or gastroscopy. In this procedure, your health care provider passes a thin, flexible tube (endoscope) through your mouth and down your esophagus into your stomach. A small camera is attached to the end of the tube. Images from the camera appear on a monitor in the exam room. During this procedure, your health care provider may also remove a small piece of tissue to be sent to a lab and examined under a microscope (biopsy). Your health care provider may do an upper endoscopy to diagnose cancers of the upper GI tract. You may also have this procedure to find the cause of other conditions, such as:  Stomach pain.  Heartburn.  Pain or problems when swallowing.  Nausea and vomiting.  Stomach bleeding.  Stomach ulcers. Tell a health care provider about:  Any allergies you have.  All medicines you are taking,  including vitamins, herbs, eye drops, creams, and over-the-counter medicines.  Any problems you or family members have had with anesthetic medicines.  Any blood disorders you have.  Any surgeries you have had.  Any medical conditions you have.  Whether you are pregnant or may be pregnant. What are the risks? Generally, this is a safe procedure. However, problems may occur, including:  Infection.  Bleeding.  Allergic reactions to medicines.  A tear or hole (perforation) in the  esophagus, stomach, or duodenum. What happens before the procedure? Staying hydrated Follow instructions from your health care provider about hydration, which may include:  Up to 2 hours before the procedure - you may continue to drink clear liquids, such as water, clear fruit juice, black coffee, and plain tea.  Eating and drinking restrictions Follow instructions from your health care provider about eating and drinking, which may include:  8 hours before the procedure - stop eating heavy meals or foods, such as meat, fried foods, or fatty foods.  6 hours before the procedure - stop eating light meals or foods, such as toast or cereal.  6 hours before the procedure - stop drinking milk or drinks that contain milk.  2 hours before the procedure - stop drinking clear liquids. Medicines Ask your health care provider about:  Changing or stopping your regular medicines. This is especially important if you are taking diabetes medicines or blood thinners.  Taking medicines such as aspirin and ibuprofen. These medicines can thin your blood. Do not take these medicines unless your health care provider tells you to take them.  Taking over-the-counter medicines, vitamins, herbs, and supplements. General instructions  Plan to have someone take you home from the hospital or clinic.  If you will be going home right after the procedure, plan to have someone with you for 24 hours.  Ask your health care provider what steps will be taken to help prevent infection. What happens during the procedure?   An IV will be inserted into one of your veins.  You may be given one or more of the following: ? A medicine to help you relax (sedative). ? A medicine to numb the throat (local anesthetic).  You will lie on your left side on an exam table.  Your health care provider will pass the endoscope through your mouth and down your esophagus.  Your health care provider will use the scope to check the  inside of your esophagus, stomach, and duodenum. Biopsies may be taken.  The endoscope will be removed. The procedure may vary among health care providers and hospitals. What happens after the procedure?  Your blood pressure, heart rate, breathing rate, and blood oxygen level will be monitored until you leave the hospital or clinic.  Do not drive for 24 hours if you were given a sedative during your procedure.  When your throat is no longer numb, you may be given some fluids to drink.  It is up to you to get the results of your procedure. Ask your health care provider, or the department that is doing the procedure, when your results will be ready. Summary  Upper endoscopy is a procedure to look inside the upper GI tract.  During the procedure, an IV will be inserted into one of your veins. You may be given a medicine to help you relax.  A medicine will be used to numb your throat.  The endoscope will be passed through your mouth and down your esophagus. This information is not intended to  replace advice given to you by your health care provider. Make sure you discuss any questions you have with your health care provider. Document Released: 03/07/2000 Document Revised: 08/10/2017 Document Reviewed: 08/10/2017 Elsevier Interactive Patient Education  2019 [employer].

## 2018-04-14 ENCOUNTER — Encounter (HOSPITAL_COMMUNITY)
Admission: RE | Admit: 2018-04-14 | Discharge: 2018-04-14 | Disposition: A | Discharge status: Home / Self Care | Payer: Medicare Other | Source: Ambulatory Visit | Attending: Internal Medicine | Admitting: Internal Medicine

## 2018-04-14 ENCOUNTER — Encounter (HOSPITAL_COMMUNITY): Payer: Self-pay

## 2018-04-14 ENCOUNTER — Other Ambulatory Visit: Payer: Self-pay

## 2018-04-14 DIAGNOSIS — E039 Hypothyroidism, unspecified: Secondary | ICD-10-CM | POA: Diagnosis not present

## 2018-04-14 DIAGNOSIS — F419 Anxiety disorder, unspecified: Secondary | ICD-10-CM | POA: Diagnosis not present

## 2018-04-14 DIAGNOSIS — K449 Diaphragmatic hernia without obstruction or gangrene: Secondary | ICD-10-CM | POA: Diagnosis not present

## 2018-04-14 DIAGNOSIS — D649 Anemia, unspecified: Secondary | ICD-10-CM | POA: Diagnosis not present

## 2018-04-14 DIAGNOSIS — Z833 Family history of diabetes mellitus: Secondary | ICD-10-CM | POA: Diagnosis not present

## 2018-04-14 DIAGNOSIS — K5730 Diverticulosis of large intestine without perforation or abscess without bleeding: Secondary | ICD-10-CM | POA: Diagnosis not present

## 2018-04-14 DIAGNOSIS — Z853 Personal history of malignant neoplasm of breast: Secondary | ICD-10-CM | POA: Diagnosis not present

## 2018-04-14 DIAGNOSIS — Z880 Allergy status to penicillin: Secondary | ICD-10-CM | POA: Diagnosis not present

## 2018-04-14 DIAGNOSIS — I10 Essential (primary) hypertension: Secondary | ICD-10-CM | POA: Diagnosis not present

## 2018-04-14 DIAGNOSIS — Z9012 Acquired absence of left breast and nipple: Secondary | ICD-10-CM | POA: Diagnosis not present

## 2018-04-14 DIAGNOSIS — Z7902 Long term (current) use of antithrombotics/antiplatelets: Secondary | ICD-10-CM | POA: Diagnosis not present

## 2018-04-14 DIAGNOSIS — K3189 Other diseases of stomach and duodenum: Secondary | ICD-10-CM | POA: Diagnosis not present

## 2018-04-14 DIAGNOSIS — Z8673 Personal history of transient ischemic attack (TIA), and cerebral infarction without residual deficits: Secondary | ICD-10-CM | POA: Diagnosis not present

## 2018-04-14 DIAGNOSIS — Z8049 Family history of malignant neoplasm of other genital organs: Secondary | ICD-10-CM | POA: Diagnosis not present

## 2018-04-14 DIAGNOSIS — F329 Major depressive disorder, single episode, unspecified: Secondary | ICD-10-CM | POA: Diagnosis not present

## 2018-04-14 DIAGNOSIS — Z8249 Family history of ischemic heart disease and other diseases of the circulatory system: Secondary | ICD-10-CM | POA: Diagnosis not present

## 2018-04-14 DIAGNOSIS — Z01812 Encounter for preprocedural laboratory examination: Secondary | ICD-10-CM | POA: Insufficient documentation

## 2018-04-14 DIAGNOSIS — K921 Melena: Secondary | ICD-10-CM | POA: Diagnosis not present

## 2018-04-14 DIAGNOSIS — Z91030 Bee allergy status: Secondary | ICD-10-CM | POA: Diagnosis not present

## 2018-04-14 DIAGNOSIS — Z882 Allergy status to sulfonamides status: Secondary | ICD-10-CM | POA: Diagnosis not present

## 2018-04-14 DIAGNOSIS — H539 Unspecified visual disturbance: Secondary | ICD-10-CM | POA: Diagnosis not present

## 2018-04-14 DIAGNOSIS — Z7982 Long term (current) use of aspirin: Secondary | ICD-10-CM | POA: Diagnosis not present

## 2018-04-14 DIAGNOSIS — Z79899 Other long term (current) drug therapy: Secondary | ICD-10-CM | POA: Diagnosis not present

## 2018-04-14 HISTORY — DX: Malignant (primary) neoplasm, unspecified: C80.1

## 2018-04-14 HISTORY — DX: Anxiety disorder, unspecified: F41.9

## 2018-04-14 LAB — BASIC METABOLIC PANEL
ANION GAP: 7 (ref 5–15)
BUN: 16 mg/dL (ref 8–23)
CO2: 24 mmol/L (ref 22–32)
Calcium: 8.9 mg/dL (ref 8.9–10.3)
Chloride: 108 mmol/L (ref 98–111)
Creatinine, Ser: 0.62 mg/dL (ref 0.44–1.00)
Glucose, Bld: 122 mg/dL — ABNORMAL HIGH (ref 70–99)
POTASSIUM: 3.4 mmol/L — AB (ref 3.5–5.1)
Sodium: 139 mmol/L (ref 135–145)

## 2018-04-16 ENCOUNTER — Encounter (HOSPITAL_COMMUNITY)
Admission: RE | Disposition: A | Discharge status: Home / Self Care | Payer: Self-pay | Source: Home / Self Care | Attending: Internal Medicine

## 2018-04-16 ENCOUNTER — Encounter (HOSPITAL_COMMUNITY): Payer: Self-pay | Admitting: *Deleted

## 2018-04-16 ENCOUNTER — Ambulatory Visit (HOSPITAL_COMMUNITY): Payer: Medicare Other | Admitting: Anesthesiology

## 2018-04-16 ENCOUNTER — Ambulatory Visit (HOSPITAL_COMMUNITY)
Admission: RE | Admit: 2018-04-16 | Discharge: 2018-04-16 | Disposition: A | Discharge status: Home / Self Care | Payer: Medicare Other | Attending: Internal Medicine | Admitting: Internal Medicine

## 2018-04-16 DIAGNOSIS — Z91030 Bee allergy status: Secondary | ICD-10-CM | POA: Insufficient documentation

## 2018-04-16 DIAGNOSIS — Z853 Personal history of malignant neoplasm of breast: Secondary | ICD-10-CM | POA: Diagnosis not present

## 2018-04-16 DIAGNOSIS — E039 Hypothyroidism, unspecified: Secondary | ICD-10-CM | POA: Insufficient documentation

## 2018-04-16 DIAGNOSIS — Z9012 Acquired absence of left breast and nipple: Secondary | ICD-10-CM | POA: Insufficient documentation

## 2018-04-16 DIAGNOSIS — K3189 Other diseases of stomach and duodenum: Secondary | ICD-10-CM | POA: Insufficient documentation

## 2018-04-16 DIAGNOSIS — K449 Diaphragmatic hernia without obstruction or gangrene: Secondary | ICD-10-CM | POA: Diagnosis not present

## 2018-04-16 DIAGNOSIS — Z8673 Personal history of transient ischemic attack (TIA), and cerebral infarction without residual deficits: Secondary | ICD-10-CM | POA: Insufficient documentation

## 2018-04-16 DIAGNOSIS — I10 Essential (primary) hypertension: Secondary | ICD-10-CM | POA: Insufficient documentation

## 2018-04-16 DIAGNOSIS — H539 Unspecified visual disturbance: Secondary | ICD-10-CM | POA: Insufficient documentation

## 2018-04-16 DIAGNOSIS — Z882 Allergy status to sulfonamides status: Secondary | ICD-10-CM | POA: Insufficient documentation

## 2018-04-16 DIAGNOSIS — Z7902 Long term (current) use of antithrombotics/antiplatelets: Secondary | ICD-10-CM | POA: Insufficient documentation

## 2018-04-16 DIAGNOSIS — K921 Melena: Secondary | ICD-10-CM | POA: Insufficient documentation

## 2018-04-16 DIAGNOSIS — Z79899 Other long term (current) drug therapy: Secondary | ICD-10-CM | POA: Insufficient documentation

## 2018-04-16 DIAGNOSIS — Z8249 Family history of ischemic heart disease and other diseases of the circulatory system: Secondary | ICD-10-CM | POA: Insufficient documentation

## 2018-04-16 DIAGNOSIS — K5730 Diverticulosis of large intestine without perforation or abscess without bleeding: Secondary | ICD-10-CM | POA: Diagnosis not present

## 2018-04-16 DIAGNOSIS — D649 Anemia, unspecified: Secondary | ICD-10-CM | POA: Diagnosis not present

## 2018-04-16 DIAGNOSIS — Z833 Family history of diabetes mellitus: Secondary | ICD-10-CM | POA: Insufficient documentation

## 2018-04-16 DIAGNOSIS — Z8049 Family history of malignant neoplasm of other genital organs: Secondary | ICD-10-CM | POA: Insufficient documentation

## 2018-04-16 DIAGNOSIS — F419 Anxiety disorder, unspecified: Secondary | ICD-10-CM | POA: Diagnosis not present

## 2018-04-16 DIAGNOSIS — Z7982 Long term (current) use of aspirin: Secondary | ICD-10-CM | POA: Insufficient documentation

## 2018-04-16 DIAGNOSIS — Z880 Allergy status to penicillin: Secondary | ICD-10-CM | POA: Insufficient documentation

## 2018-04-16 DIAGNOSIS — F329 Major depressive disorder, single episode, unspecified: Secondary | ICD-10-CM | POA: Insufficient documentation

## 2018-04-16 HISTORY — PX: BIOPSY: SHX5522

## 2018-04-16 HISTORY — PX: ESOPHAGOGASTRODUODENOSCOPY (EGD) WITH PROPOFOL: SHX5813

## 2018-04-16 SURGERY — ESOPHAGOGASTRODUODENOSCOPY (EGD) WITH PROPOFOL
Anesthesia: Monitor Anesthesia Care

## 2018-04-16 MED ORDER — CHLORHEXIDINE GLUCONATE CLOTH 2 % EX PADS: 6.0000 | MEDICATED_PAD | Freq: Once | CUTANEOUS | Status: DC

## 2018-04-16 MED ORDER — LACTATED RINGERS IV SOLN: INTRAVENOUS | Status: DC

## 2018-04-16 MED ORDER — LACTATED RINGERS IV SOLN
INTRAVENOUS | Status: DC | PRN
  Administered 2018-04-16: 13:00:00 via INTRAVENOUS

## 2018-04-16 MED ORDER — PROPOFOL 10 MG/ML IV BOLUS
INTRAVENOUS | Status: DC | PRN
  Administered 2018-04-16: 20 mg via INTRAVENOUS

## 2018-04-16 MED ORDER — PROPOFOL 500 MG/50ML IV EMUL
INTRAVENOUS | Status: DC | PRN
  Administered 2018-04-16: 150 ug/kg/min via INTRAVENOUS

## 2018-04-16 NOTE — Transfer of Care (Signed)
Immediate Anesthesia Transfer of Care Note  Patient: Crystal Rodriguez  Procedure(s) Performed: ESOPHAGOGASTRODUODENOSCOPY (EGD) WITH PROPOFOL (N/A ) BIOPSY  Patient Location: PACU  Anesthesia Type:MAC  Level of Consciousness: awake  Airway & Oxygen Therapy: Patient Spontanous Breathing  Post-op Assessment: Report given to RN  Post vital signs: Reviewed and stable  Last Vitals:  Vitals Value Taken Time  BP 138/50 04/16/2018  1:54 PM  Temp    Pulse 52 04/16/2018  1:56 PM  Resp    SpO2 99 % 04/16/2018  1:56 PM  Vitals shown include unvalidated device data.  Last Pain:  Vitals:   04/16/18 1321  TempSrc:   PainSc: 0-No pain      Patients Stated Pain Goal: 4 (31/54/00 8676)  Complications: No apparent anesthesia complications

## 2018-04-16 NOTE — Op Note (Signed)
[facility] Patient Name: Crystal Rodriguez Procedure Date: 04/16/2018 11:56 AM MRN: [id number] Date of Birth: October 24, 1932 Attending MD: Hildred Laser , MD CSN: [id number] Age: 85 Admit Type: Outpatient Procedure:                Upper GI endoscopy Indications:              Melena Providers:                Hildred Laser, MD, Charlsie Quest. Theda Sers RN, RN, Aram Candela Referring MD:             Glenda Chroman MD Medicines:                Propofol per Anesthesia Complications:            No immediate complications. Estimated Blood Loss:     Estimated blood loss was minimal. Procedure:                Pre-Anesthesia Assessment:                           - Prior to the procedure, a History and Physical                            was performed, and patient medications and                            allergies were reviewed. The patient's tolerance of                            previous anesthesia was also reviewed. The risks                            and benefits of the procedure and the sedation                            options and risks were discussed with the patient.                            All questions were answered, and informed consent                            was obtained. Prior Anticoagulants: The patient                            last took aspirin 2 days prior to the procedure.                            ASA Grade Assessment: III - A patient with severe                            systemic disease. After reviewing the risks and  benefits, the patient was deemed in satisfactory                            condition to undergo the procedure.                           After obtaining informed consent, the endoscope was                            passed under direct vision. Throughout the                            procedure, the patient's blood pressure, pulse, and                            oxygen saturations were monitored  continuously. The                            GIF-H190 ([id number]) scope was introduced through the                            mouth, and advanced to the second part of duodenum.                            The upper GI endoscopy was accomplished without                            difficulty. The patient tolerated the procedure                            well. Scope In: 1:35:00 PM Scope Out: 1:45:33 PM Total Procedure Duration: 0 hours 10 minutes 33 seconds  Findings:      The examined esophagus was normal.      The Z-line was regular and was found 36 cm from the incisors.      A 4 cm hiatal hernia was present.      Patchy mildly erythematous mucosa without bleeding was found in the       gastric antrum and in the prepyloric region of the stomach. Biopsies       were taken with a cold forceps for histology.      The exam of the stomach was otherwise normal.      The duodenal bulb and second portion of the duodenum were normal. Impression:               - Normal esophagus.                           - Z-line regular, 36 cm from the incisors.                           - 4 cm hiatal hernia.                           - Erythematous mucosa in the antrum and prepyloric  region of the stomach. Biopsied.                           - Normal duodenal bulb and second portion of the                            duodenum. Moderate Sedation:      Per Anesthesia Care Recommendation:           - Patient has a contact number available for                            emergencies. The signs and symptoms of potential                            delayed complications were discussed with the                            patient. Return to normal activities tomorrow.                            Written discharge instructions were provided to the                            patient.                           - Resume previous diet.                           - Continue present medications.                            - No aspirin, ibuprofen, naproxen, or other                            non-steroidal anti-inflammatory drugs for 1 day.                           - Await pathology results. Procedure Code(s):        --- Professional ---                           [phone], Esophagogastroduodenoscopy, flexible,                            transoral; with biopsy, single or multiple Diagnosis Code(s):        --- Professional ---                           K44.9, Diaphragmatic hernia without obstruction or                            gangrene                           K31.89, Other diseases of stomach and duodenum  K92.1, Melena (includes Hematochezia) CPT copyright 2018 American Medical Association. All rights reserved. The codes documented in this report are preliminary and upon coder review may  be revised to meet current compliance requirements. Hildred Laser, MD Hildred Laser, MD 04/16/2018 2:01:33 PM This report has been signed electronically. Number of Addenda: 0

## 2018-04-16 NOTE — Anesthesia Postprocedure Evaluation (Signed)
Anesthesia Post Note  Patient: Crystal Rodriguez  Procedure(s) Performed: ESOPHAGOGASTRODUODENOSCOPY (EGD) WITH PROPOFOL (N/A ) BIOPSY  Patient location during evaluation: PACU Anesthesia Type: MAC Level of consciousness: awake and alert and oriented Pain management: pain level controlled Vital Signs Assessment: post-procedure vital signs reviewed and stable Respiratory status: spontaneous breathing Cardiovascular status: blood pressure returned to baseline and stable Postop Assessment: no apparent nausea or vomiting Anesthetic complications: no     Last Vitals:  Vitals:   04/16/18 1216 04/16/18 1225  BP:  (!) 158/60  Pulse: (!) 54   Resp: 18   Temp: 36.4 C   SpO2: 95%     Last Pain:  Vitals:   04/16/18 1321  TempSrc:   PainSc: 0-No pain                 Valissa Lyvers

## 2018-04-16 NOTE — Anesthesia Preprocedure Evaluation (Signed)
Anesthesia Evaluation  Patient identified by MRN, date of birth, ID band Patient awake    Reviewed: Allergy & Precautions, H&P , NPO status , Patient's Chart, lab work & pertinent test results  Airway Mallampati: II  TM Distance: >3 FB Neck ROM: full    Dental no notable dental hx. (+) Partial Lower, Partial Upper, Missing   Pulmonary neg pulmonary ROS,    Pulmonary exam normal breath sounds clear to auscultation       Cardiovascular Exercise Tolerance: Good hypertension, negative cardio ROS   Rhythm:regular Rate:Normal     Neuro/Psych PSYCHIATRIC DISORDERS Anxiety Depression CVA negative neurological ROS  negative psych ROS   GI/Hepatic negative GI ROS, Neg liver ROS,   Endo/Other  negative endocrine ROSdiabetesHypothyroidism   Renal/GU negative Renal ROS  negative genitourinary   Musculoskeletal   Abdominal   Peds  Hematology negative hematology ROS (+) Blood dyscrasia, anemia ,   Anesthesia Other Findings Breast cancer, left breast (HCC)  Reproductive/Obstetrics negative OB ROS                             Anesthesia Physical Anesthesia Plan  ASA: III  Anesthesia Plan: MAC   Post-op Pain Management:    Induction:   PONV Risk Score and Plan:   Airway Management Planned:   Additional Equipment:   Intra-op Plan:   Post-operative Plan:   Informed Consent: I have reviewed the patients History and Physical, chart, labs and discussed the procedure including the risks, benefits and alternatives for the proposed anesthesia with the patient or authorized representative who has indicated his/her understanding and acceptance.     Dental Advisory Given  Plan Discussed with: CRNA  Anesthesia Plan Comments:         Anesthesia Quick Evaluation

## 2018-04-16 NOTE — Discharge Instructions (Signed)
Resume aspirin on April 17, 2018. Resume other medications as before. Resume usual diet. No driving for 24 hours. Physician will call with biopsy results next week.

## 2018-04-16 NOTE — H&P (Signed)
Crystal Rodriguez is an 85 y.o. female.   Chief Complaint: Patient is here for esophagogastroduodenoscopy. HPI: Patient is 85 year old Caucasian female who is been having melena off and on for months.  She was found to have hemoglobin of 6.6 g per Dr. Woody Seller.  He was admitted to [facility] received 4 units of blood.  She had been on Plavix which was discontinued.  She is now on low-dose iron which was stopped 2 days ago.  She denies heartburn nausea vomiting dysphagia or epigastric pain.  She does complain of occasional LLQ abdominal pain.  She has had diarrhea as well as fecal seepage or incontinence.  However she is not had bright red blood per rectum.  She says her appetite has improved since she was begun on pantoprazole.  She states for a while she was chewing on ice but this symptom has resolved.  She states she has lost about 10 pounds over the last several weeks.  There is no history of peptic ulcer disease.  Last colonoscopy was in 2008 revealing colonic diverticulosis. Patient's hemoglobin 8 days ago was 11.4 g.  Past Medical History:  Diagnosis Date  . Anxiety   . Cancer [facility])    left breast  . Esophageal spasm    2011- Dr Delfin Edis  . Hypertension    just started taking BP meds  . Hypothyroidism   . Stroke [facility])    no deficits  . Vision abnormalities     Past Surgical History:  Procedure Laterality Date  . BREAST SURGERY  11/13/11   Lt parial mastectomy  . left knee surgery    . TEE WITHOUT CARDIOVERSION N/A 05/28/2015   Procedure: TRANSESOPHAGEAL ECHOCARDIOGRAM (TEE);  Surgeon: Skeet Latch, MD;  Location: [facility] ENDOSCOPY;  Service: Cardiovascular;  Laterality: N/A;    Family History  Problem Relation Age of Onset  . Heart disease Mother   . Diabetes Mother   . Cancer Mother        Theadora Rama   Social History:  reports that she has never smoked. She has never used smokeless tobacco. She reports that she does not drink alcohol or use drugs.  Allergies:  Allergies   Allergen Reactions  . Bee Venom Swelling  . Penicillins Swelling and Other (See Comments)    Has patient had a PCN reaction causing immediate rash, facial/tongue/throat swelling, SOB or lightheadedness with hypotension: No Has patient had a PCN reaction causing severe rash involving mucus membranes or skin necrosis: No Has patient had a PCN reaction that required hospitalization No Has patient had a PCN reaction occurring within the last 10 years: No If all of the above answers are "NO", then may proceed with Cephalosporin use.  . Sulfonamide Derivatives Other (See Comments)    unknown    Medications Prior to Admission  Medication Sig Dispense Refill  . aspirin 81 MG tablet Take 81 mg by mouth daily.     Marland Kitchen atorvastatin (LIPITOR) 10 MG tablet Take 5 mg by mouth daily.   12  . Calcium Carbonate-Vit D-Min (CALTRATE 600+D PLUS MINERALS PO) Take 1 tablet by mouth daily.     . clopidogrel (PLAVIX) 75 MG tablet Take 75 mg by mouth daily.  12  . donepezil (ARICEPT) 5 MG tablet Take 5 mg by mouth daily.    Marland Kitchen EPINEPHrine 0.3 mg/0.3 mL IJ SOAJ injection Inject 0.3 mg into the muscle as needed (for bee stings).     Marland Kitchen levothyroxine (SYNTHROID, LEVOTHROID) 112 MCG tablet Take 112 mcg  by mouth daily before breakfast.     . pantoprazole (PROTONIX) 40 MG tablet Take 40 mg by mouth daily.    . sertraline (ZOLOFT) 100 MG tablet Take 100 mg by mouth daily.       Results for orders placed or performed during the hospital encounter of 04/14/18 (from the past 48 hour(s))  Basic metabolic panel     Status: Abnormal   Collection Time: 04/14/18  3:28 PM  Result Value Ref Range   Sodium 139 135 - 145 mmol/L   Potassium 3.4 (L) 3.5 - 5.1 mmol/L   Chloride 108 98 - 111 mmol/L   CO2 24 22 - 32 mmol/L   Glucose, Bld 122 (H) 70 - 99 mg/dL   BUN 16 8 - 23 mg/dL   Creatinine, Ser 0.62 0.44 - 1.00 mg/dL   Calcium 8.9 8.9 - 10.3 mg/dL   GFR calc non Af Amer >60 >60 mL/min   GFR calc Af Amer >60 >60 mL/min    Anion gap 7 5 - 15    Comment: Performed at [facility], [address] [place], [place] [address]   No results found.  ROS  Blood pressure (!) 158/60, pulse (!) 54, temperature 97.6 F (36.4 C), temperature source Oral, resp. rate 18, SpO2 95 %. Physical Exam  Constitutional: She appears well-developed and well-nourished.  HENT:  Mouth/Throat: Oropharynx is clear and moist.  Eyes: Conjunctivae are normal. No scleral icterus.  Neck: No thyromegaly present.  Cardiovascular: Normal rate, regular rhythm and normal heart sounds.  No murmur heard. Respiratory: Effort normal and breath sounds normal.  GI:  Abdomen is full.  It is soft with mild tenderness at LLQ.  No organomegaly or masses.  Musculoskeletal:        General: No edema.  Lymphadenopathy:    She has no cervical adenopathy.  Neurological: She is alert.  Skin: Skin is warm and dry.     Assessment/Plan History of melena. History of profound anemia. Status post transfusion with 4 units of PRBCs. Diagnostic esophagogastroduodenoscopy.  Hildred Laser, MD 04/16/2018, 1:10 PM

## 2018-04-20 NOTE — Telephone Encounter (Signed)
Results given to patient

## 2018-04-21 ENCOUNTER — Encounter (HOSPITAL_COMMUNITY): Payer: Self-pay | Admitting: Internal Medicine

## 2018-04-21 ENCOUNTER — Other Ambulatory Visit (INDEPENDENT_AMBULATORY_CARE_PROVIDER_SITE_OTHER): Payer: Self-pay | Admitting: *Deleted

## 2018-04-21 ENCOUNTER — Encounter (INDEPENDENT_AMBULATORY_CARE_PROVIDER_SITE_OTHER): Payer: Self-pay | Admitting: *Deleted

## 2018-04-21 DIAGNOSIS — K5730 Diverticulosis of large intestine without perforation or abscess without bleeding: Secondary | ICD-10-CM

## 2018-04-21 DIAGNOSIS — K921 Melena: Secondary | ICD-10-CM

## 2018-04-27 DIAGNOSIS — C44722 Squamous cell carcinoma of skin of right lower limb, including hip: Secondary | ICD-10-CM | POA: Diagnosis not present

## 2018-04-27 DIAGNOSIS — L821 Other seborrheic keratosis: Secondary | ICD-10-CM | POA: Diagnosis not present

## 2018-04-27 DIAGNOSIS — L280 Lichen simplex chronicus: Secondary | ICD-10-CM | POA: Diagnosis not present

## 2018-04-27 DIAGNOSIS — D485 Neoplasm of uncertain behavior of skin: Secondary | ICD-10-CM | POA: Diagnosis not present

## 2018-04-27 DIAGNOSIS — L0100 Impetigo, unspecified: Secondary | ICD-10-CM | POA: Diagnosis not present

## 2018-04-27 DIAGNOSIS — L570 Actinic keratosis: Secondary | ICD-10-CM | POA: Diagnosis not present

## 2018-05-06 DIAGNOSIS — C44722 Squamous cell carcinoma of skin of right lower limb, including hip: Secondary | ICD-10-CM | POA: Diagnosis not present

## 2018-05-21 DIAGNOSIS — K5730 Diverticulosis of large intestine without perforation or abscess without bleeding: Secondary | ICD-10-CM | POA: Diagnosis not present

## 2018-05-21 DIAGNOSIS — K921 Melena: Secondary | ICD-10-CM | POA: Diagnosis not present

## 2018-05-21 LAB — CBC
HCT: 34.6 % — ABNORMAL LOW (ref 35.0–45.0)
Hemoglobin: 11.4 g/dL — ABNORMAL LOW (ref 11.7–15.5)
MCH: 24.4 pg — ABNORMAL LOW (ref 27.0–33.0)
MCHC: 32.9 g/dL (ref 32.0–36.0)
MCV: 74.1 fL — ABNORMAL LOW (ref 80.0–100.0)
MPV: 9.1 fL (ref 7.5–12.5)
Platelets: 227 10*3/uL (ref 140–400)
RBC: 4.67 10*6/uL (ref 3.80–5.10)
RDW: 21.8 % — ABNORMAL HIGH (ref 11.0–15.0)
WBC: 3.7 10*3/uL — ABNORMAL LOW (ref 3.8–10.8)

## 2018-05-26 ENCOUNTER — Other Ambulatory Visit (INDEPENDENT_AMBULATORY_CARE_PROVIDER_SITE_OTHER): Payer: Self-pay | Admitting: *Deleted

## 2018-05-26 DIAGNOSIS — K5730 Diverticulosis of large intestine without perforation or abscess without bleeding: Secondary | ICD-10-CM

## 2018-05-26 NOTE — Progress Notes (Signed)
CBC noted for May.

## 2018-06-10 DIAGNOSIS — L570 Actinic keratosis: Secondary | ICD-10-CM | POA: Diagnosis not present

## 2018-06-10 DIAGNOSIS — L309 Dermatitis, unspecified: Secondary | ICD-10-CM | POA: Diagnosis not present

## 2018-06-10 DIAGNOSIS — D485 Neoplasm of uncertain behavior of skin: Secondary | ICD-10-CM | POA: Diagnosis not present

## 2018-06-21 DIAGNOSIS — L570 Actinic keratosis: Secondary | ICD-10-CM | POA: Diagnosis not present

## 2018-06-21 DIAGNOSIS — D485 Neoplasm of uncertain behavior of skin: Secondary | ICD-10-CM | POA: Diagnosis not present

## 2018-06-21 DIAGNOSIS — D487 Neoplasm of uncertain behavior of other specified sites: Secondary | ICD-10-CM | POA: Diagnosis not present

## 2018-07-14 ENCOUNTER — Encounter (INDEPENDENT_AMBULATORY_CARE_PROVIDER_SITE_OTHER): Payer: Self-pay | Admitting: *Deleted

## 2018-07-14 ENCOUNTER — Other Ambulatory Visit (INDEPENDENT_AMBULATORY_CARE_PROVIDER_SITE_OTHER): Payer: Self-pay | Admitting: *Deleted

## 2018-07-14 DIAGNOSIS — K5730 Diverticulosis of large intestine without perforation or abscess without bleeding: Secondary | ICD-10-CM

## 2018-07-27 DIAGNOSIS — K5730 Diverticulosis of large intestine without perforation or abscess without bleeding: Secondary | ICD-10-CM | POA: Diagnosis not present

## 2018-07-27 LAB — CBC
HCT: 29 % — ABNORMAL LOW (ref 35.0–45.0)
Hemoglobin: 9.5 g/dL — ABNORMAL LOW (ref 11.7–15.5)
MCH: 23.8 pg — ABNORMAL LOW (ref 27.0–33.0)
MCHC: 32.8 g/dL (ref 32.0–36.0)
MCV: 72.5 fL — ABNORMAL LOW (ref 80.0–100.0)
MPV: 9.3 fL (ref 7.5–12.5)
Platelets: 235 10*3/uL (ref 140–400)
RBC: 4 10*6/uL (ref 3.80–5.10)
RDW: 14.1 % (ref 11.0–15.0)
WBC: 3.8 10*3/uL (ref 3.8–10.8)

## 2018-07-28 ENCOUNTER — Other Ambulatory Visit (INDEPENDENT_AMBULATORY_CARE_PROVIDER_SITE_OTHER): Payer: Self-pay | Admitting: *Deleted

## 2018-07-28 DIAGNOSIS — L280 Lichen simplex chronicus: Secondary | ICD-10-CM | POA: Diagnosis not present

## 2018-07-28 DIAGNOSIS — D649 Anemia, unspecified: Secondary | ICD-10-CM

## 2018-07-28 DIAGNOSIS — L910 Hypertrophic scar: Secondary | ICD-10-CM | POA: Diagnosis not present

## 2018-07-28 DIAGNOSIS — L570 Actinic keratosis: Secondary | ICD-10-CM | POA: Diagnosis not present

## 2018-07-29 ENCOUNTER — Other Ambulatory Visit (INDEPENDENT_AMBULATORY_CARE_PROVIDER_SITE_OTHER): Payer: Self-pay | Admitting: *Deleted

## 2018-08-11 ENCOUNTER — Encounter (INDEPENDENT_AMBULATORY_CARE_PROVIDER_SITE_OTHER): Payer: Self-pay | Admitting: *Deleted

## 2018-08-11 ENCOUNTER — Other Ambulatory Visit (INDEPENDENT_AMBULATORY_CARE_PROVIDER_SITE_OTHER): Payer: Self-pay | Admitting: *Deleted

## 2018-08-11 DIAGNOSIS — D649 Anemia, unspecified: Secondary | ICD-10-CM

## 2018-08-24 DIAGNOSIS — D649 Anemia, unspecified: Secondary | ICD-10-CM | POA: Diagnosis not present

## 2018-08-24 LAB — HEMOGLOBIN AND HEMATOCRIT, BLOOD
HCT: 34.5 % — ABNORMAL LOW (ref 35.0–45.0)
Hemoglobin: 10.9 g/dL — ABNORMAL LOW (ref 11.7–15.5)

## 2018-08-26 ENCOUNTER — Other Ambulatory Visit (INDEPENDENT_AMBULATORY_CARE_PROVIDER_SITE_OTHER): Payer: Self-pay | Admitting: *Deleted

## 2018-08-26 DIAGNOSIS — D649 Anemia, unspecified: Secondary | ICD-10-CM

## 2018-09-16 DIAGNOSIS — I10 Essential (primary) hypertension: Secondary | ICD-10-CM | POA: Diagnosis not present

## 2018-09-16 DIAGNOSIS — Z6832 Body mass index (BMI) 32.0-32.9, adult: Secondary | ICD-10-CM | POA: Diagnosis not present

## 2018-09-16 DIAGNOSIS — R413 Other amnesia: Secondary | ICD-10-CM | POA: Diagnosis not present

## 2018-09-16 DIAGNOSIS — E039 Hypothyroidism, unspecified: Secondary | ICD-10-CM | POA: Diagnosis not present

## 2018-09-16 DIAGNOSIS — Z299 Encounter for prophylactic measures, unspecified: Secondary | ICD-10-CM | POA: Diagnosis not present

## 2018-09-16 DIAGNOSIS — D649 Anemia, unspecified: Secondary | ICD-10-CM | POA: Diagnosis not present

## 2018-09-28 DIAGNOSIS — Z7902 Long term (current) use of antithrombotics/antiplatelets: Secondary | ICD-10-CM | POA: Diagnosis not present

## 2018-09-28 DIAGNOSIS — S52572A Other intraarticular fracture of lower end of left radius, initial encounter for closed fracture: Secondary | ICD-10-CM | POA: Diagnosis not present

## 2018-09-28 DIAGNOSIS — Z7982 Long term (current) use of aspirin: Secondary | ICD-10-CM | POA: Diagnosis not present

## 2018-09-28 DIAGNOSIS — Z79899 Other long term (current) drug therapy: Secondary | ICD-10-CM | POA: Diagnosis not present

## 2018-09-28 DIAGNOSIS — E7800 Pure hypercholesterolemia, unspecified: Secondary | ICD-10-CM | POA: Diagnosis not present

## 2018-09-28 DIAGNOSIS — Z4789 Encounter for other orthopedic aftercare: Secondary | ICD-10-CM | POA: Diagnosis not present

## 2018-09-28 DIAGNOSIS — W108XXA Fall (on) (from) other stairs and steps, initial encounter: Secondary | ICD-10-CM | POA: Diagnosis not present

## 2018-09-29 DIAGNOSIS — Z4789 Encounter for other orthopedic aftercare: Secondary | ICD-10-CM | POA: Diagnosis not present

## 2018-10-01 ENCOUNTER — Ambulatory Visit: Payer: Medicare Other | Admitting: Orthopaedic Surgery

## 2018-10-01 DIAGNOSIS — Z6836 Body mass index (BMI) 36.0-36.9, adult: Secondary | ICD-10-CM | POA: Diagnosis not present

## 2018-10-01 DIAGNOSIS — D509 Iron deficiency anemia, unspecified: Secondary | ICD-10-CM | POA: Diagnosis not present

## 2018-10-01 DIAGNOSIS — I10 Essential (primary) hypertension: Secondary | ICD-10-CM | POA: Diagnosis not present

## 2018-10-01 DIAGNOSIS — C50919 Malignant neoplasm of unspecified site of unspecified female breast: Secondary | ICD-10-CM | POA: Diagnosis not present

## 2018-10-01 DIAGNOSIS — S5292XA Unspecified fracture of left forearm, initial encounter for closed fracture: Secondary | ICD-10-CM | POA: Diagnosis not present

## 2018-10-01 DIAGNOSIS — Z299 Encounter for prophylactic measures, unspecified: Secondary | ICD-10-CM | POA: Diagnosis not present

## 2018-10-04 ENCOUNTER — Ambulatory Visit (INDEPENDENT_AMBULATORY_CARE_PROVIDER_SITE_OTHER): Payer: Medicare Other

## 2018-10-04 ENCOUNTER — Ambulatory Visit (INDEPENDENT_AMBULATORY_CARE_PROVIDER_SITE_OTHER): Payer: Medicare Other | Admitting: Orthopaedic Surgery

## 2018-10-04 ENCOUNTER — Ambulatory Visit: Payer: Medicare Other | Admitting: Orthopaedic Surgery

## 2018-10-04 ENCOUNTER — Encounter: Payer: Self-pay | Admitting: Orthopaedic Surgery

## 2018-10-04 ENCOUNTER — Other Ambulatory Visit: Payer: Self-pay

## 2018-10-04 VITALS — BP 181/58 | HR 61 | Resp 16 | Ht 62.0 in | Wt 179.0 lb

## 2018-10-04 DIAGNOSIS — S62102A Fracture of unspecified carpal bone, left wrist, initial encounter for closed fracture: Secondary | ICD-10-CM | POA: Insufficient documentation

## 2018-10-04 DIAGNOSIS — M25532 Pain in left wrist: Secondary | ICD-10-CM

## 2018-10-04 NOTE — Progress Notes (Signed)
Office Visit Note   Patient: Crystal Rodriguez           Date of Birth: [birth year]           MRN: [id number] Visit Date: 10/04/2018              Requested by: Crystal Chroman, MD [place],   [id number] PCP: Crystal Chroman, MD   Assessment & Plan: Visit Diagnoses:  1. Pain in left wrist   2. Wrist fracture, closed, left, initial encounter     Plan: Comminuted, displaced, intra-articular fracture of the left distal radius.  Will proceed with open reduction internal fixation  Follow-Up Instructions: Return We will schedule surgery for open reduction and internal fixation.   Orders:  Orders Placed This Encounter  Procedures  . XR Wrist Complete Left   No orders of the defined types were placed in this encounter.     Procedures: No procedures performed   Clinical Data: No additional findings.   Subjective: Chief Complaint  Patient presents with  . Left Wrist - Injury   Crystal Rodriguez fell 09/27/2018. She has some pain, swelling and bruising. She takes hydrocodone for pain.  Seen at [facility] in [place] with x-rays demonstrating a distal radius fracture.  She was placed in a splint and then referred to the office today.  Has been comfortable  HPI  Review of Systems  Constitutional: Positive for fatigue.  HENT: Positive for trouble swallowing.   Eyes: Positive for visual disturbance.  Respiratory: Negative for shortness of breath.   Cardiovascular: Positive for leg swelling.  Gastrointestinal: Positive for constipation and diarrhea.  Endocrine: Positive for cold intolerance.  Genitourinary: Positive for frequency.  Musculoskeletal: Positive for joint swelling.  Skin: Positive for rash.  Allergic/Immunologic: Negative for food allergies.  Neurological: Positive for weakness and numbness.  Hematological: Bruises/bleeds easily.  Psychiatric/Behavioral: Negative for sleep disturbance.     Objective: Vital Signs: BP (!) 181/58 (BP Location: Right Arm,  Patient Position: Sitting, Cuff Size: Normal)   Pulse 61   Resp 16   Ht 5\' 2"  (1.575 m)   Wt 179 lb (81.2 kg)   BMI 32.74 kg/m   Physical Exam Constitutional:      Appearance: She is well-developed.  Eyes:     Pupils: Pupils are equal, round, and reactive to light.  Pulmonary:     Effort: Pulmonary effort is normal.  Skin:    General: Skin is warm and dry.  Neurological:     Mental Status: She is alert and oriented to person, place, and time.  Psychiatric:        Behavior: Behavior normal.     Ortho Exam splint removed from left upper extremity.  There is some mild swelling of the fingers.  Resolving areas of ecchymosis.  Some mild skin avulsions in the dorsum of the wrist but not volarly.  Good sensation to the fingers.  Radial deviation of the wrist with tenderness over the distal radius.  No pain over the ulna  Specialty Comments:  No specialty comments available.  Imaging: Xr Wrist Complete Left  Result Date: 10/04/2018 Films of the left wrist were obtained in her splint in several projections.  There is a distal radius fracture with intra-articular extension and comminution.  Distal ulna appears to be intact.  No fractures about the carpus.    PMFS History: Patient Active Problem List   Diagnosis Date Noted  . Wrist fracture, closed, left, initial encounter 10/04/2018  .  Melena 04/08/2018  . Absolute anemia 06/10/2017  . Chronic ischemic right MCA stroke   . Embolic stroke ([place] [facility]) 62/22/9798  . Breast cancer, left breast (C[place] Rodriguez) 10/29/2011  . HYPOTHYROIDISM 01/15/2009  . DIABETES MELLITUS, TYPE II 01/15/2009  . HYPERCHOLESTEROLEMIA 01/15/2009  . DEPRESSION 01/15/2009  . DIVERTICULOSIS, COLON 01/15/2009  . DYSPHAGIA UNSPECIFIED 01/15/2009   Past Medical History:  Diagnosis Date  . Anxiety   . Cancer [facility])    left breast  . Esophageal spasm    2011- Dr Crystal Rodriguez  . Hypertension    just started taking BP meds  . Hypothyroidism   . Stroke [facility])    no  deficits  . Vision abnormalities     Family History  Problem Relation Age of Onset  . Heart disease Mother   . Diabetes Mother   . Cancer Mother        Crystal Rodriguez    Past Surgical History:  Procedure Laterality Date  . BIOPSY  04/16/2018   Procedure: BIOPSY;  Surgeon: Crystal Houston, MD;  [facility];  Service: Endoscopy;;  gastric   . BREAST SURGERY  11/13/11   Lt parial mastectomy  . ESOPHAGOGASTRODUODENOSCOPY (EGD) WITH PROPOFOL N/A 04/16/2018   Procedure: ESOPHAGOGASTRODUODENOSCOPY (EGD) WITH PROPOFOL;  Surgeon: Crystal Houston, MD;  [facility];  Service: Endoscopy;  Laterality: N/A;  . left knee surgery    . TEE WITHOUT CARDIOVERSION N/A 05/28/2015   Procedure: TRANSESOPHAGEAL ECHOCARDIOGRAM (TEE);  Surgeon: Crystal Latch, MD;  Location: [place];  Service: Cardiovascular;  Laterality: N/A;   Social History   Occupational History  . Not on file  Tobacco Use  . Smoking status: Never Smoker  . Smokeless tobacco: Never Used  Substance and Sexual Activity  . Alcohol use: No  . Drug use: No  . Sexual activity: Not Currently    Birth control/protection: None

## 2018-10-05 ENCOUNTER — Other Ambulatory Visit: Payer: Self-pay

## 2018-10-05 ENCOUNTER — Other Ambulatory Visit (HOSPITAL_COMMUNITY)
Admission: RE | Admit: 2018-10-05 | Discharge: 2018-10-05 | Disposition: A | Discharge status: Home / Self Care | Payer: Medicare Other | Source: Ambulatory Visit | Attending: Orthopaedic Surgery | Admitting: Orthopaedic Surgery

## 2018-10-05 ENCOUNTER — Encounter (HOSPITAL_BASED_OUTPATIENT_CLINIC_OR_DEPARTMENT_OTHER): Payer: Self-pay | Admitting: *Deleted

## 2018-10-05 DIAGNOSIS — Z1159 Encounter for screening for other viral diseases: Secondary | ICD-10-CM | POA: Insufficient documentation

## 2018-10-05 NOTE — H&P (Signed)
Crystal Rodriguez is an 85 y.o. female.   Chief Complaint:  Painful left wrist HPI: [name] fell 09/27/2018. She has some pain, swelling and bruising. She takes hydrocodone for pain.  Seen at [facility] in [place] with x-rays demonstrating a distal radius fracture.  She was placed in a splint and then referred to the office today.  Has been comfortable  Past Medical History:  Diagnosis Date  . Anemia   . Anxiety   . Cancer ([place])    left breast,  . Closed fracture of left distal radius   . Esophageal spasm    2011- Dr Delfin Edis  . GERD (gastroesophageal reflux disease)   . Hypertension    no meds  . Hypothyroidism   . Melena   . Stroke [facility])    no deficits  . Vision abnormalities     Past Surgical History:  Procedure Laterality Date  . BIOPSY  04/16/2018   Procedure: BIOPSY;  Surgeon: Rogene Houston, MD;  [facility];  Service: Endoscopy;;  gastric   . BREAST SURGERY  11/13/11   Lt parial mastectomy  . ESOPHAGOGASTRODUODENOSCOPY (EGD) WITH PROPOFOL N/A 04/16/2018   Procedure: ESOPHAGOGASTRODUODENOSCOPY (EGD) WITH PROPOFOL;  Surgeon: Rogene Houston, MD;  [facility];  Service: Endoscopy;  Laterality: N/A;  . left knee surgery    . TEE WITHOUT CARDIOVERSION N/A 05/28/2015   Procedure: TRANSESOPHAGEAL ECHOCARDIOGRAM (TEE);  Surgeon: Skeet Latch, MD;  Location: [facility] ENDOSCOPY;  Service: Cardiovascular;  Laterality: N/A;    Family History  Problem Relation Age of Onset  . Heart disease Mother   . Diabetes Mother   . Cancer Mother        Theadora Rama   Social History:  reports that she has never smoked. She has never used smokeless tobacco. She reports that she does not drink alcohol or use drugs.  Allergies:  Allergies  Allergen Reactions  . Bee Venom Swelling  . Penicillins Swelling and Other (See Comments)    Has patient had a PCN reaction causing immediate rash, facial/tongue/throat swelling, SOB or lightheadedness with hypotension: No Has  patient had a PCN reaction causing severe rash involving mucus membranes or skin necrosis: No Has patient had a PCN reaction that required hospitalization No Has patient had a PCN reaction occurring within the last 10 years: No If all of the above answers are "NO", then may proceed with Cephalosporin use.  . Sulfonamide Derivatives Other (See Comments)    unknown   No current facility-administered medications for this encounter.    Current Outpatient Medications  Medication Sig Dispense Refill  . aspirin 81 MG tablet Take 1 tablet (81 mg total) by mouth daily. 30 tablet   . atorvastatin (LIPITOR) 10 MG tablet Take 5 mg by mouth daily.   12  . Calcium Carbonate-Vit D-Min (CALTRATE 600+D PLUS MINERALS PO) Take 1 tablet by mouth daily.     . clopidogrel (PLAVIX) 75 MG tablet Take 75 mg by mouth daily.    Marland Kitchen donepezil (ARICEPT) 5 MG tablet Take 5 mg by mouth daily.    Marland Kitchen HYDROcodone-acetaminophen (NORCO/VICODIN) 5-325 MG tablet TAKE 1 TABLET BY MOUTH EVERY 6 HOURS AS NEEDED FOR SEVERE PAIN    . ibuprofen (ADVIL) 800 MG tablet Take 800 mg by mouth every 8 (eight) hours as needed.    Marland Kitchen levothyroxine (SYNTHROID, LEVOTHROID) 112 MCG tablet Take 112 mcg by mouth daily before breakfast.     . pantoprazole (PROTONIX) 40 MG tablet Take 40  mg by mouth daily.    . sertraline (ZOLOFT) 100 MG tablet Take 100 mg by mouth daily.     Marland Kitchen EPINEPHrine 0.3 mg/0.3 mL IJ SOAJ injection Inject 0.3 mg into the muscle as needed (for bee stings).     Marland Kitchen ketoconazole (NIZORAL) 2 % cream APPLY TO THE AFFECTED AREA(S) ON ABDOMEN AND BETWEEN LEGS FOLD TWICE DAILY    . mupirocin ointment (BACTROBAN) 2 % APPLY TO BIOPSY SITES ON CHEST TWICE DAILY       No medications prior to admission.    No results found for this or any previous visit (from the past 48 hour(s)). Xr Wrist Complete Left  Result Date: 10/04/2018 Films of the left wrist were obtained in her splint in several projections.  There is a distal radius fracture  with intra-articular extension and comminution.  Distal ulna appears to be intact.  No fractures about the carpus.  Review of Systems  Constitutional: Positive for fatigue.  HENT: Positive for trouble swallowing.   Eyes: Positive for visual disturbance.  Respiratory: Negative for shortness of breath.   Cardiovascular: Positive for leg swelling.  Gastrointestinal: Positive for constipation and diarrhea.  Endocrine: Positive for cold intolerance.  Genitourinary: Positive for frequency.  Musculoskeletal: Positive for joint swelling.  Skin: Positive for rash.  Allergic/Immunologic: Negative for food allergies.  Neurological: Positive for weakness and numbness.  Hematological: Bruises/bleeds easily.  Psychiatric/Behavioral: Negative for sleep disturbance.    Height 5\' 2"  (1.575 m), weight 81.2 kg. Physical Exam  Constitutional: She is oriented to person, place, and time. She appears well-developed.  HENT:  Head: Normocephalic and atraumatic.  Eyes: Pupils are equal, round, and reactive to light. Conjunctivae and EOM are normal.  Neck: Neck supple.  Cardiovascular: Normal rate, regular rhythm and intact distal pulses.  Respiratory: Effort normal and breath sounds normal.  GI: Soft. Bowel sounds are normal.  Neurological: She is alert and oriented to person, place, and time. She has normal reflexes.  Skin: Skin is warm and dry.  Psychiatric: She has a normal mood and affect. Her behavior is normal. Judgment and thought content normal.     Assessment/Plan Intra-articular left distal radius fracture  Plan for ORIF left distal radius fracture.  Biagio Borg, PA-C 10/05/2018, 1:49 PM

## 2018-10-06 LAB — SARS CORONAVIRUS 2 (TAT 6-24 HRS): SARS Coronavirus 2: NEGATIVE

## 2018-10-07 ENCOUNTER — Encounter (HOSPITAL_BASED_OUTPATIENT_CLINIC_OR_DEPARTMENT_OTHER)
Admission: RE | Disposition: A | Discharge status: Home / Self Care | Payer: Self-pay | Source: Home / Self Care | Attending: Orthopaedic Surgery

## 2018-10-07 ENCOUNTER — Ambulatory Visit (HOSPITAL_BASED_OUTPATIENT_CLINIC_OR_DEPARTMENT_OTHER): Payer: Medicare Other | Admitting: Anesthesiology

## 2018-10-07 ENCOUNTER — Other Ambulatory Visit: Payer: Self-pay

## 2018-10-07 ENCOUNTER — Ambulatory Visit (HOSPITAL_BASED_OUTPATIENT_CLINIC_OR_DEPARTMENT_OTHER)
Admission: RE | Admit: 2018-10-07 | Discharge: 2018-10-07 | Disposition: A | Discharge status: Home / Self Care | Payer: Medicare Other | Attending: Orthopaedic Surgery | Admitting: Orthopaedic Surgery

## 2018-10-07 ENCOUNTER — Encounter (HOSPITAL_BASED_OUTPATIENT_CLINIC_OR_DEPARTMENT_OTHER): Payer: Self-pay

## 2018-10-07 DIAGNOSIS — Z882 Allergy status to sulfonamides status: Secondary | ICD-10-CM | POA: Diagnosis not present

## 2018-10-07 DIAGNOSIS — Z880 Allergy status to penicillin: Secondary | ICD-10-CM | POA: Diagnosis not present

## 2018-10-07 DIAGNOSIS — Z7982 Long term (current) use of aspirin: Secondary | ICD-10-CM | POA: Diagnosis not present

## 2018-10-07 DIAGNOSIS — Z8673 Personal history of transient ischemic attack (TIA), and cerebral infarction without residual deficits: Secondary | ICD-10-CM | POA: Diagnosis not present

## 2018-10-07 DIAGNOSIS — W19XXXA Unspecified fall, initial encounter: Secondary | ICD-10-CM | POA: Insufficient documentation

## 2018-10-07 DIAGNOSIS — S52572A Other intraarticular fracture of lower end of left radius, initial encounter for closed fracture: Secondary | ICD-10-CM | POA: Diagnosis not present

## 2018-10-07 DIAGNOSIS — Z79899 Other long term (current) drug therapy: Secondary | ICD-10-CM | POA: Insufficient documentation

## 2018-10-07 DIAGNOSIS — K219 Gastro-esophageal reflux disease without esophagitis: Secondary | ICD-10-CM | POA: Insufficient documentation

## 2018-10-07 DIAGNOSIS — I10 Essential (primary) hypertension: Secondary | ICD-10-CM | POA: Diagnosis not present

## 2018-10-07 DIAGNOSIS — Z833 Family history of diabetes mellitus: Secondary | ICD-10-CM | POA: Insufficient documentation

## 2018-10-07 DIAGNOSIS — F418 Other specified anxiety disorders: Secondary | ICD-10-CM | POA: Diagnosis not present

## 2018-10-07 DIAGNOSIS — D649 Anemia, unspecified: Secondary | ICD-10-CM | POA: Diagnosis not present

## 2018-10-07 DIAGNOSIS — Z853 Personal history of malignant neoplasm of breast: Secondary | ICD-10-CM | POA: Insufficient documentation

## 2018-10-07 DIAGNOSIS — E039 Hypothyroidism, unspecified: Secondary | ICD-10-CM | POA: Diagnosis not present

## 2018-10-07 DIAGNOSIS — Z6831 Body mass index (BMI) 31.0-31.9, adult: Secondary | ICD-10-CM | POA: Diagnosis not present

## 2018-10-07 DIAGNOSIS — Z808 Family history of malignant neoplasm of other organs or systems: Secondary | ICD-10-CM | POA: Insufficient documentation

## 2018-10-07 DIAGNOSIS — F419 Anxiety disorder, unspecified: Secondary | ICD-10-CM | POA: Insufficient documentation

## 2018-10-07 DIAGNOSIS — E669 Obesity, unspecified: Secondary | ICD-10-CM | POA: Insufficient documentation

## 2018-10-07 DIAGNOSIS — S52502A Unspecified fracture of the lower end of left radius, initial encounter for closed fracture: Secondary | ICD-10-CM | POA: Diagnosis not present

## 2018-10-07 DIAGNOSIS — Z791 Long term (current) use of non-steroidal anti-inflammatories (NSAID): Secondary | ICD-10-CM | POA: Insufficient documentation

## 2018-10-07 DIAGNOSIS — Z91030 Bee allergy status: Secondary | ICD-10-CM | POA: Insufficient documentation

## 2018-10-07 DIAGNOSIS — Z8249 Family history of ischemic heart disease and other diseases of the circulatory system: Secondary | ICD-10-CM | POA: Insufficient documentation

## 2018-10-07 HISTORY — DX: Melena: K92.1

## 2018-10-07 HISTORY — DX: Unspecified fracture of the lower end of left radius, initial encounter for closed fracture: S52.502A

## 2018-10-07 HISTORY — DX: Gastro-esophageal reflux disease without esophagitis: K21.9

## 2018-10-07 HISTORY — DX: Anemia, unspecified: D64.9

## 2018-10-07 HISTORY — PX: OPEN REDUCTION INTERNAL FIXATION (ORIF) DISTAL RADIAL FRACTURE: SHX5989

## 2018-10-07 LAB — BASIC METABOLIC PANEL
Anion gap: 13 (ref 5–15)
BUN: 13 mg/dL (ref 8–23)
CO2: 23 mmol/L (ref 22–32)
Calcium: 9.5 mg/dL (ref 8.9–10.3)
Chloride: 103 mmol/L (ref 98–111)
Creatinine, Ser: 0.68 mg/dL (ref 0.44–1.00)
GFR calc Af Amer: >60 mL/min (ref >60)
GFR calc non Af Amer: >60 mL/min (ref >60)
Glucose, Bld: 117 mg/dL — ABNORMAL HIGH (ref 70–99)
Potassium: 3.5 mmol/L (ref 3.5–5.1)
Sodium: 139 mmol/L (ref 135–145)

## 2018-10-07 SURGERY — OPEN REDUCTION INTERNAL FIXATION (ORIF) DISTAL RADIUS FRACTURE
Anesthesia: Monitor Anesthesia Care | Site: Wrist | Laterality: Left

## 2018-10-07 MED ORDER — PROPOFOL 10 MG/ML IV BOLUS
INTRAVENOUS | Status: AC
  Filled 2018-10-07: qty 40

## 2018-10-07 MED ORDER — ACETAMINOPHEN 10 MG/ML IV SOLN
INTRAVENOUS | Status: AC
  Filled 2018-10-07: qty 100

## 2018-10-07 MED ORDER — CEFAZOLIN SODIUM-DEXTROSE 2-4 GM/100ML-% IV SOLN
2.0000 g | INTRAVENOUS | Status: AC
  Administered 2018-10-07: 13:00:00 2 g via INTRAVENOUS

## 2018-10-07 MED ORDER — FENTANYL CITRATE (PF) 100 MCG/2ML IJ SOLN
INTRAMUSCULAR | Status: AC
  Filled 2018-10-07: qty 2

## 2018-10-07 MED ORDER — SODIUM CHLORIDE 0.9 % IV SOLN: INTRAVENOUS | Status: DC

## 2018-10-07 MED ORDER — MIDAZOLAM HCL 2 MG/2ML IJ SOLN
INTRAMUSCULAR | Status: AC
  Filled 2018-10-07: qty 2

## 2018-10-07 MED ORDER — SCOPOLAMINE 1 MG/3DAYS TD PT72: 1.0000 | MEDICATED_PATCH | Freq: Once | TRANSDERMAL | Status: DC

## 2018-10-07 MED ORDER — LIDOCAINE 2% (20 MG/ML) 5 ML SYRINGE
INTRAMUSCULAR | Status: AC
  Filled 2018-10-07: qty 5

## 2018-10-07 MED ORDER — CEFAZOLIN SODIUM-DEXTROSE 2-4 GM/100ML-% IV SOLN
INTRAVENOUS | Status: AC
  Filled 2018-10-07: qty 100

## 2018-10-07 MED ORDER — BUPIVACAINE-EPINEPHRINE (PF) 0.5% -1:200000 IJ SOLN
INTRAMUSCULAR | Status: AC
  Filled 2018-10-07: qty 30

## 2018-10-07 MED ORDER — HYDROCODONE-ACETAMINOPHEN 5-325 MG PO TABS: 1.0000 | ORAL_TABLET | Freq: Four times a day (QID) | ORAL | 0 refills | Status: DC | PRN

## 2018-10-07 MED ORDER — ONDANSETRON HCL 4 MG/2ML IJ SOLN
INTRAMUSCULAR | Status: DC | PRN
  Administered 2018-10-07: 4 mg via INTRAVENOUS

## 2018-10-07 MED ORDER — FENTANYL CITRATE (PF) 100 MCG/2ML IJ SOLN
50.0000 ug | INTRAMUSCULAR | Status: DC | PRN
  Administered 2018-10-07: 50 ug via INTRAVENOUS

## 2018-10-07 MED ORDER — LACTATED RINGERS IV SOLN
INTRAVENOUS | Status: DC
  Administered 2018-10-07: 11:00:00 via INTRAVENOUS

## 2018-10-07 MED ORDER — MIDAZOLAM HCL 2 MG/2ML IJ SOLN: 1.0000 mg | INTRAMUSCULAR | Status: DC | PRN

## 2018-10-07 MED ORDER — ONDANSETRON HCL 4 MG/2ML IJ SOLN
INTRAMUSCULAR | Status: AC
  Filled 2018-10-07: qty 2

## 2018-10-07 MED ORDER — LIDOCAINE 2% (20 MG/ML) 5 ML SYRINGE
INTRAMUSCULAR | Status: DC | PRN
  Administered 2018-10-07: 50 mg via INTRAVENOUS

## 2018-10-07 MED ORDER — ACETAMINOPHEN 10 MG/ML IV SOLN
1000.0000 mg | Freq: Once | INTRAVENOUS | Status: AC
  Administered 2018-10-07: 1000 mg via INTRAVENOUS

## 2018-10-07 MED ORDER — ROPIVACAINE HCL 7.5 MG/ML IJ SOLN
INTRAMUSCULAR | Status: DC | PRN
  Administered 2018-10-07: 30 mL via PERINEURAL

## 2018-10-07 MED ORDER — PROPOFOL 500 MG/50ML IV EMUL
INTRAVENOUS | Status: DC | PRN
  Administered 2018-10-07: 200 ug/kg/min via INTRAVENOUS

## 2018-10-07 MED ORDER — BUPIVACAINE HCL (PF) 0.5 % IJ SOLN
INTRAMUSCULAR | Status: AC
  Filled 2018-10-07: qty 30

## 2018-10-07 SURGICAL SUPPLY — 79 items
BANDAGE ACE 6X5 VEL STRL LF (GAUZE/BANDAGES/DRESSINGS) IMPLANT
BIT DRILL 2 FAST STEP (BIT) ×2 IMPLANT
BIT DRILL 2.5X4 QC (BIT) ×2 IMPLANT
BLADE SURG 15 STRL LF DISP TIS (BLADE) ×1 IMPLANT
BLADE SURG 15 STRL SS (BLADE) ×3
BNDG CMPR 9X4 STRL LF SNTH (GAUZE/BANDAGES/DRESSINGS) ×1
BNDG ELASTIC 4X5.8 VLCR STR LF (GAUZE/BANDAGES/DRESSINGS) ×3 IMPLANT
BNDG ESMARK 4X9 LF (GAUZE/BANDAGES/DRESSINGS) ×3 IMPLANT
BNDG GAUZE ELAST 4 BULKY (GAUZE/BANDAGES/DRESSINGS) ×3 IMPLANT
BONE CHIP PRESERV 5CC PCAN5 (Bone Implant) ×3 IMPLANT
CANISTER SUCT 1200ML W/VALVE (MISCELLANEOUS) ×3 IMPLANT
COVER BACK TABLE REUSABLE LG (DRAPES) ×3 IMPLANT
COVER MAYO STAND REUSABLE (DRAPES) ×3 IMPLANT
COVER WAND RF STERILE (DRAPES) IMPLANT
DECANTER SPIKE VIAL GLASS SM (MISCELLANEOUS) IMPLANT
DRAPE EXTREMITY T 121X128X90 (DISPOSABLE) ×3 IMPLANT
DRAPE HALF SHEET 70X43 (DRAPES) ×2 IMPLANT
DRAPE INCISE IOBAN 66X45 STRL (DRAPES) ×2 IMPLANT
DRAPE OEC MINIVIEW 54X84 (DRAPES) ×3 IMPLANT
DRSG EMULSION OIL 3X3 NADH (GAUZE/BANDAGES/DRESSINGS) ×3 IMPLANT
DURAPREP 26ML APPLICATOR (WOUND CARE) ×3 IMPLANT
ELECT REM PT RETURN 9FT ADLT (ELECTROSURGICAL) ×3
ELECTRODE REM PT RTRN 9FT ADLT (ELECTROSURGICAL) ×1 IMPLANT
GLOVE BIO SURGEON STRL SZ7.5 (GLOVE) IMPLANT
GLOVE BIO SURGEON STRL SZ8.5 (GLOVE) ×2 IMPLANT
GLOVE BIOGEL PI IND STRL 7.0 (GLOVE) IMPLANT
GLOVE BIOGEL PI IND STRL 8 (GLOVE) ×1 IMPLANT
GLOVE BIOGEL PI IND STRL 8.5 (GLOVE) IMPLANT
GLOVE BIOGEL PI INDICATOR 7.0 (GLOVE) ×2
GLOVE BIOGEL PI INDICATOR 8 (GLOVE) ×4
GLOVE BIOGEL PI INDICATOR 8.5 (GLOVE) ×2
GLOVE ECLIPSE 6.5 STRL STRAW (GLOVE) ×2 IMPLANT
GLOVE ECLIPSE 8.0 STRL XLNG CF (GLOVE) ×3 IMPLANT
GOWN STRL REUS W/ TWL LRG LVL3 (GOWN DISPOSABLE) ×1 IMPLANT
GOWN STRL REUS W/ TWL XL LVL3 (GOWN DISPOSABLE) IMPLANT
GOWN STRL REUS W/TWL LRG LVL3 (GOWN DISPOSABLE) ×6
GOWN STRL REUS W/TWL XL LVL3 (GOWN DISPOSABLE) ×3
GRAFT BNE CANC CHIPS 1-8 5CC (Bone Implant) IMPLANT
K-WIRE 1.6 (WIRE) ×6
K-WIRE FX5X1.6XNS BN SS (WIRE) ×2
KWIRE FX5X1.6XNS BN SS (WIRE) IMPLANT
NEEDLE HYPO 22GX1.5 SAFETY (NEEDLE) ×3 IMPLANT
NS IRRIG 1000ML POUR BTL (IV SOLUTION) ×3 IMPLANT
PACK BASIN DAY SURGERY FS (CUSTOM PROCEDURE TRAY) ×3 IMPLANT
PAD CAST 3X4 CTTN HI CHSV (CAST SUPPLIES) ×1 IMPLANT
PAD CAST 4YDX4 CTTN HI CHSV (CAST SUPPLIES) ×1 IMPLANT
PADDING CAST ABS 4INX4YD NS (CAST SUPPLIES) ×2
PADDING CAST ABS COTTON 4X4 ST (CAST SUPPLIES) ×1 IMPLANT
PADDING CAST COTTON 3X4 STRL (CAST SUPPLIES) ×3
PADDING CAST COTTON 4X4 STRL (CAST SUPPLIES) ×3
PEG SUBCHONDRAL SMOOTH 2.0X16 (Peg) ×4 IMPLANT
PEG THREADED 2.5MMX12MM LONG (Peg) ×2 IMPLANT
PEG THREADED 2.5MMX18MM LONG (Peg) ×4 IMPLANT
PEG THREADED 2.5MMX20MM LONG (Peg) ×2 IMPLANT
PENCIL BUTTON HOLSTER BLD 10FT (ELECTRODE) ×3 IMPLANT
PLATE SHORT 24.4X51.3 LT (Plate) ×2 IMPLANT
SCREW BN 12X3.5XNS CORT TI (Screw) IMPLANT
SCREW CORT 3.5X12 (Screw) ×3 IMPLANT
SCREW CORT 3.5X14 LNG (Screw) ×2 IMPLANT
SCREW CORT 3.5X16 LNG (Screw) ×4 IMPLANT
SCREW MULTI DIRECT 20MM (Screw) ×2 IMPLANT
SLING ARM FOAM STRAP MED (SOFTGOODS) ×2 IMPLANT
SPLINT PLASTER CAST XFAST 3X15 (CAST SUPPLIES) IMPLANT
SPLINT PLASTER XTRA FASTSET 3X (CAST SUPPLIES)
SPLINT WRIST 10 LEFT UNV (SOFTGOODS) ×2 IMPLANT
STOCKINETTE 4X48 STRL (DRAPES) ×3 IMPLANT
SUCTION FRAZIER HANDLE 10FR (MISCELLANEOUS) ×2
SUCTION TUBE FRAZIER 10FR DISP (MISCELLANEOUS) ×1 IMPLANT
SUT ETHILON 3 0 PS 1 (SUTURE) ×3 IMPLANT
SUT ETHILON 4 0 PS 2 18 (SUTURE) IMPLANT
SUT VIC AB 0 SH 27 (SUTURE) ×4 IMPLANT
SUT VIC AB 2-0 SH 27 (SUTURE) ×3
SUT VIC AB 2-0 SH 27XBRD (SUTURE) ×1 IMPLANT
SYR BULB 3OZ (MISCELLANEOUS) ×3 IMPLANT
SYR CONTROL 10ML LL (SYRINGE) ×3 IMPLANT
TOWEL GREEN STERILE FF (TOWEL DISPOSABLE) ×3 IMPLANT
TUBE CONNECTING 20'X1/4 (TUBING) ×1
TUBE CONNECTING 20X1/4 (TUBING) ×2 IMPLANT
UNDERPAD 30X30 (UNDERPADS AND DIAPERS) ×3 IMPLANT

## 2018-10-07 NOTE — H&P (Signed)
The recent History & Physical has been reviewed. I have personally examined the patient today. There is no interval change to the documented History & Physical. The patient would like to proceed with the procedure.  Garald Balding 10/07/2018,  1:10 PM

## 2018-10-07 NOTE — Op Note (Signed)
NAME: Crystal Rodriguez, COTTONE MEDICAL RECORD [id number] ACCOUNT [id number] DATE OF BIRTH:11/30/1932 FACILITY: [facility] LOCATION: [facility] PHYSICIAN:PETER Sharlotte Alamo, MD  OPERATIVE REPORT  DATE OF PROCEDURE:  10/07/2018  PREOPERATIVE DIAGNOSIS:  Comminuted, displaced left distal radius fracture.  POSTOPERATIVE DIAGNOSIS:  Comminuted, displaced left distal radius fracture.  PROCEDURE:  Open reduction internal fixation.  SURGEON:  Joni Fears, MD  ASSISTANT:  Biagio Borg, PA-C.  ANESTHESIA:  IV sedation and axillary nerve block.  COMPLICATIONS:  None.  HISTORY:  An 85 year old female who sustained a fall within the past week.  She was seen in the office earlier in this week with evidence of a comminuted displaced and closed distal radius fracture.  She has had a prior open reduction internal fixation  of a similar fracture on the right wrist with good results and she wished to proceed with open reduction internal fixation.  DESCRIPTION OF PROCEDURE:  The patient was met in the holding area and identified the left wrist as the appropriate operative site.  Anesthesia performed the above nerve block without complications.  The patient was then transported to room 6.  She was  carefully placed on the operating table and IV sedation administered.  We applied a left arm tourniquet.  Left upper extremity was then prepped with chlorhexidine scrub and DuraPrep from the tourniquet to the tips of the fingers.  Sterile draping was  performed.  A timeout was called.  The left upper extremity was then elevated, it was Esmarch exsanguinated with a proximal tourniquet at 250 mmHg.  A longitudinal incision about 8 cm in length was made over the flexor carpi radialis tendon coursing in a 60-degree angle across the proximal flexion crease at the wrist.  By sharp dissection, incision carried down to subcutaneous tissue.  Fascia overlying the flexor carpi radialis was incised and the FCR was  then retracted ulnarly.  Flexor palmaris longus muscle and tendon were identified and carefully retracted ulnarly as well.  The deep space of Bo Mcclintock was entered via blunt dissection and the above was retracted laterally.  The pronator quadratus was then identified and intact.  It was incised transversely across the watershed line and then on the radial aspect of its attachment.  It was then elevated with a Freer and retracted ulnarly.  The fracture was then identified.  There was considerable comminution distally with intraarticular extension.  The fracture was reduced.  The radial styloid was in addition to being comminuted, displaced.  There was considerable compaction of the  metaphyseal bone.  I then supplemented this with small pieces of allograft cancellous bone.  The hand innovation distal radius plate and screws were utilized.  The plate was applied to the distal radius.  The oblong screw hole was then drilled, measured and filled with the self-tapping screw.  We then positioned the plate in excellent position  based on the image intensification.  We fixed it distally with a single K wire.  The proximal holes in the T portion of the plate were then drilled, measured and filled with threaded screws.  We checked to be sure we were well within bone.  The radial styloid was fixed with a 0 Vicryl suture, maintained the position and fixed with a single multiaxial screw.  We checked position with image intensification.  The remaining distal screw holes in the T portion of the plate were then drilled,  measured and filled with the smooth pins.  We again checked to be sure on image intensification that we had good position  without any protrusion of the screws into the joint.  The remaining screws in the plate were then inserted.  We drilled, measured and filled with the self-tapping screws.  We had excellent purchase with each of the 3 holes.  Final images revealed good position.  Clinically, the  fracture was stable.  I added further bone graft to areas that were compacted distally.  The wound was irrigated with saline solution, closed the pronator quadratus with interrupted 2-0 Vicryl.  The wound was again irrigated.  The subcutaneous was closed with 3-0 Monocryl, skin closed with 4-0 nylon.  Sterile bulky dressing was applied followed by a splint.  The tourniquet was released with immediate capillary refill to the fingers.  PLAN:  Hydrocodone for pain.  Office in 1 week.  TN/NUANCE  D:10/07/2018 T:10/07/2018 JOB:[id number] 

## 2018-10-07 NOTE — Discharge Instructions (Signed)
Regional Anesthesia Blocks ? ?1. Numbness or the inability to move the "blocked" extremity may last from 3-48 hours after placement. The length of time depends on the medication injected and your individual response to the medication. If the numbness is not going away after 48 hours, call your surgeon. ? ?2. The extremity that is blocked will need to be protected until the numbness is gone and the  Strength has returned. Because you cannot feel it, you will need to take extra care to avoid injury. Because it may be weak, you may have difficulty moving it or using it. You may not know what position it is in without looking at it while the block is in effect. ? ?3. For blocks in the legs and feet, returning to weight bearing and walking needs to be done carefully. You will need to wait until the numbness is entirely gone and the strength has returned. You should be able to move your leg and foot normally before you try and bear weight or walk. You will need someone to be with you when you first try to ensure you do not fall and possibly risk injury. ? ?4. Bruising and tenderness at the needle site are common side effects and will resolve in a few days. ? ?5. Persistent numbness or new problems with movement should be communicated to the surgeon or the [facility] ([phone])/ [facility] ([phone]).  ? ?Post Anesthesia Home Care Instructions ? ?Activity: ?Get plenty of rest for the remainder of the day. A responsible individual must stay with you for 24 hours following the procedure.  ?For the next 24 hours, DO NOT: ?-Drive a car ?-Operate machinery ?-Drink alcoholic beverages ?-Take any medication unless instructed by your physician ?-Make any legal decisions or sign important papers. ? ?Meals: ?Start with liquid foods such as gelatin or soup. Progress to regular foods as tolerated. Avoid greasy, spicy, heavy foods. If nausea and/or vomiting occur, drink only clear liquids until the  nausea and/or vomiting subsides. Call your physician if vomiting continues. ? ?Special Instructions/Symptoms: ?Your throat may feel dry or sore from the anesthesia or the breathing tube placed in your throat during surgery. If this causes discomfort, gargle with warm salt water. The discomfort should disappear within 24 hours. ? ?If you had a scopolamine patch placed behind your ear for the management of post- operative nausea and/or vomiting: ? ?1. The medication in the patch is effective for 72 hours, after which it should be removed.  Wrap patch in a tissue and discard in the trash. Wash hands thoroughly with soap and water. ?2. You may remove the patch earlier than 72 hours if you experience unpleasant side effects which may include dry mouth, dizziness or visual disturbances. ?3. Avoid touching the patch. Wash your hands with soap and water after contact with the patch. ?    ?

## 2018-10-07 NOTE — Transfer of Care (Signed)
Immediate Anesthesia Transfer of Care Note  Patient: Crystal Rodriguez  Procedure(s) Performed: LEFT OPEN REDUCTION INTERNAL FIXATION (ORIF) DISTAL RADIAL FRACTURE (Left Wrist)  Patient Location: PACU  Anesthesia Type:MAC combined with regional for post-op pain  Level of Consciousness: awake, alert  and patient cooperative  Airway & Oxygen Therapy: Patient Spontanous Breathing and Patient connected to nasal cannula oxygen  Post-op Assessment: Report given to RN and Post -op Vital signs reviewed and stable  Post vital signs: Reviewed and stable  Last Vitals:  Vitals Value Taken Time  BP 133/75 10/07/18 1530  Temp    Pulse 52 10/07/18 1534  Resp 15 10/07/18 1534  SpO2 100 % 10/07/18 1534  Vitals shown include unvalidated device data.  Last Pain:  Vitals:   10/07/18 1058  TempSrc: Oral  PainSc: 5       Patients Stated Pain Goal: 5 (19/75/88 3254)  Complications: No apparent anesthesia complications

## 2018-10-07 NOTE — Anesthesia Procedure Notes (Signed)
Anesthesia Regional Block: Axillary brachial plexus block   Pre-Anesthetic Checklist: ,, timeout performed, Correct Patient, Correct Site, Correct Laterality, Correct Procedure, Correct Position, site marked, Risks and benefits discussed,  Surgical consent,  Pre-op evaluation,  At surgeon's request and post-op pain management  Laterality: Left  Prep: chloraprep       Needles:  Injection technique: Single-shot  Needle Type: Echogenic Needle     Needle Length: 9cm  Needle Gauge: 21     Additional Needles:   Procedures:,,,, ultrasound used (permanent image in chart),,,,  Narrative:  Start time: 10/07/2018 11:19 AM End time: 10/07/2018 11:29 AM Injection made incrementally with aspirations every 5 mL.  Performed by: Personally  Anesthesiologist: Catalina Gravel, MD  Additional Notes: No pain on injection. No increased resistance to injection. Injection made in 5cc increments.  Good needle visualization.  Patient tolerated procedure well.

## 2018-10-07 NOTE — Anesthesia Procedure Notes (Signed)
Procedure Name: MAC Performed by: Terrance Mass, CRNA Pre-anesthesia Checklist: Patient identified, Emergency Drugs available, Suction available, Patient being monitored and Timeout performed Oxygen Delivery Method: Simple face mask

## 2018-10-07 NOTE — Anesthesia Postprocedure Evaluation (Signed)
Anesthesia Post Note  Patient: Crystal Rodriguez  Procedure(s) Performed: LEFT OPEN REDUCTION INTERNAL FIXATION (ORIF) DISTAL RADIAL FRACTURE (Left Wrist)     Patient location during evaluation: PACU Anesthesia Type: Regional and MAC Level of consciousness: awake and alert Pain management: pain level controlled Vital Signs Assessment: post-procedure vital signs reviewed and stable Respiratory status: spontaneous breathing, nonlabored ventilation and respiratory function stable Cardiovascular status: stable and blood pressure returned to baseline Postop Assessment: no apparent nausea or vomiting Anesthetic complications: no    Last Vitals:  Vitals:   10/07/18 1600 10/07/18 1612  BP: (!) 155/68 (!) 153/60  Pulse: (!) 51 (!) 54  Resp: 11 15  Temp:    SpO2: 99% 99%    Last Pain:  Vitals:   10/07/18 1612  TempSrc:   PainSc: 0-No pain                 Bryon Parker,W. EDMOND

## 2018-10-07 NOTE — Anesthesia Preprocedure Evaluation (Addendum)
Anesthesia Evaluation  Patient identified by MRN, date of birth, ID band Patient awake    Reviewed: Allergy & Precautions, NPO status , Patient's Chart, lab work & pertinent test results  Airway Mallampati: II  TM Distance: >3 FB Neck ROM: Full    Dental  (+) Dental Advisory Given, Partial Lower, Partial Upper   Pulmonary neg pulmonary ROS,    Pulmonary exam normal breath sounds clear to auscultation       Cardiovascular hypertension, Normal cardiovascular exam Rhythm:Regular Rate:Normal     Neuro/Psych PSYCHIATRIC DISORDERS Anxiety Depression CVA, No Residual Symptoms    GI/Hepatic Neg liver ROS, GERD  Medicated,  Endo/Other  diabetes, Type 2Hypothyroidism Obesity   Renal/GU negative Renal ROS     Musculoskeletal  LEFT DISTAL RADIUS FRACTURE   Abdominal   Peds  Hematology  (+) Blood dyscrasia (Plavix), ,   Anesthesia Other Findings Day of surgery medications reviewed with the patient.  Left breast cancer  Reproductive/Obstetrics                            Anesthesia Physical Anesthesia Plan  ASA: III  Anesthesia Plan: MAC and Regional   Post-op Pain Management:    Induction: Intravenous  PONV Risk Score and Plan: 2 and Treatment may vary due to age or medical condition and Propofol infusion  Airway Management Planned: Natural Airway and Nasal Cannula  Additional Equipment:   Intra-op Plan:   Post-operative Plan:   Informed Consent: I have reviewed the patients History and Physical, chart, labs and discussed the procedure including the risks, benefits and alternatives for the proposed anesthesia with the patient or authorized representative who has indicated his/her understanding and acceptance.     Dental advisory given  Plan Discussed with: CRNA  Anesthesia Plan Comments:        Anesthesia Quick Evaluation

## 2018-10-07 NOTE — Op Note (Signed)
PATIENT ID:      Crystal Rodriguez  MRN:     [id number] DOB/AGE:    May 31, 1932 / 85 y.o.       OPERATIVE REPORT    DATE OF PROCEDURE:  10/07/2018       PREOPERATIVE DIAGNOSIS:COMMINUTED,DISPLACED   LEFT DISTAL RADIUS FRACTURE                                                       Estimated body mass index is 31.53 kg/m as calculated from the following:   Height as of this encounter: 5\' 2"  (1.575 m).   Weight as of this encounter: 78.2 kg.     POSTOPERATIVE DIAGNOSIS: COMMINUTED,DISPLACED  LEFT DISTAL RADIUS FRACTURE                                                                     Estimated body mass index is 31.53 kg/m as calculated from the following:   Height as of this encounter: 5\' 2"  (1.575 m).   Weight as of this encounter: 78.2 kg.     PROCEDURE:  Procedure(s): LEFT OPEN REDUCTION INTERNAL FIXATION (ORIF) DISTAL RADIAL FRACTURE      SURGEON:  Joni Fears, MD    ASSISTANT:   Biagio Borg, PA-C   (Present and scrubbed throughout the case, critical for assistance with exposure, retraction, instrumentation, and closure.)          ANESTHESIA: regional and IV sedation     DRAINS: none :      TOURNIQUET TIME: * Missing tourniquet times found for documented tourniquets in log: [id number] *    COMPLICATIONS:  None   CONDITION:  stable  PROCEDURE IN DETAIL: [place] 10/07/2018, 3:05 PM

## 2018-10-07 NOTE — Progress Notes (Signed)
Assisted Dr. Turk with left, ultrasound guided, axillary block. Side rails up, monitors on throughout procedure. See vital signs in flow sheet. Tolerated Procedure well. 

## 2018-10-08 ENCOUNTER — Telehealth: Payer: Self-pay | Admitting: Orthopaedic Surgery

## 2018-10-08 ENCOUNTER — Encounter (HOSPITAL_BASED_OUTPATIENT_CLINIC_OR_DEPARTMENT_OTHER): Payer: Self-pay | Admitting: Orthopaedic Surgery

## 2018-10-11 NOTE — Telephone Encounter (Signed)
called one day post op-no related problems-comfortable

## 2018-10-14 ENCOUNTER — Ambulatory Visit (INDEPENDENT_AMBULATORY_CARE_PROVIDER_SITE_OTHER): Payer: Medicare Other | Admitting: Orthopedic Surgery

## 2018-10-14 ENCOUNTER — Encounter: Payer: Self-pay | Admitting: Orthopedic Surgery

## 2018-10-14 ENCOUNTER — Other Ambulatory Visit: Payer: Self-pay

## 2018-10-14 DIAGNOSIS — S52502A Unspecified fracture of the lower end of left radius, initial encounter for closed fracture: Secondary | ICD-10-CM | POA: Insufficient documentation

## 2018-10-14 DIAGNOSIS — S52552D Other extraarticular fracture of lower end of left radius, subsequent encounter for closed fracture with routine healing: Secondary | ICD-10-CM

## 2018-10-14 MED ORDER — HYDROCODONE-ACETAMINOPHEN 5-325 MG PO TABS: 1.0000 | ORAL_TABLET | Freq: Four times a day (QID) | ORAL | 0 refills | Status: AC | PRN

## 2018-10-14 NOTE — Progress Notes (Signed)
Office Visit Note   Patient: Crystal Rodriguez           Date of Birth: 1932/12/12           MRN: [id number] Visit Date: 10/14/2018              Requested by: Glenda Chroman, MD [place],  [place] [address] PCP: Glenda Chroman, MD   Assessment & Plan: Visit Diagnoses:  1. Other closed extra-articular fracture of distal end of left radius with routine healing, subsequent encounter     Plan:  #1: Dressing removed.  Large Band-Aid placed.  Placed back into her wrist splint. #2: Follow back up 1 week for suture removal. #3: Renewed pain medicine #4: Elevate the wrist above the level of her heart and continue non-weight bearing to this.  Follow-Up Instructions: Return in about 1 week (around 10/21/2018).   Orders:  No orders of the defined types were placed in this encounter.  Meds ordered this encounter  Medications  . HYDROcodone-acetaminophen (NORCO) 5-325 MG tablet    Sig: Take 1 tablet by mouth every 6 (six) hours as needed for moderate pain.    Dispense:  30 tablet    Refill:  0    Order Specific Question:   Supervising Provider    Answer:   Garald Balding [[id number]]      Procedures: No procedures performed   Clinical Data: No additional findings.   Subjective: No chief complaint on file.   HPI  Crystal Rodriguez is seen today now 1 week status post open reduction internal fixation of a very comminuted left distal radius fracture.  She is doing fairly well considering the amount of her damage.  She certainly has some swelling and stiffness in the hand and fingers.  A little bit of numbness at times she states.  Otherwise doing fairly well.  Review of Systems  Constitutional: Positive for fatigue.  HENT: Positive for trouble swallowing.   Eyes: Positive for visual disturbance.  Respiratory: Negative for shortness of breath.   Cardiovascular: Positive for leg swelling.  Gastrointestinal: Positive for constipation and diarrhea.  Endocrine: Positive for cold  intolerance.  Genitourinary: Positive for frequency.  Musculoskeletal: Positive for joint swelling.  Skin: Positive for rash.  Allergic/Immunologic: Negative for food allergies.  Neurological: Positive for weakness and numbness.  Hematological: Bruises/bleeds easily.  Psychiatric/Behavioral: Negative for sleep disturbance.     Objective: Vital Signs: There were no vitals taken for this visit.  Physical Exam  Ortho Exam  Exam today reveals the wound to be healing per primam with no signs of infection.  She has limited range of motion in her fingers at this time secondary to stiffness and swelling.  She states she has good sensation at the tips of the fingers.  Cap refill is less than 2 seconds in the hand.  Specialty Comments:  No specialty comments available.  Imaging: No results found.   PMFS History: Current Outpatient Medications  Medication Sig Dispense Refill  . aspirin 81 MG tablet Take 1 tablet (81 mg total) by mouth daily. 30 tablet   . atorvastatin (LIPITOR) 10 MG tablet Take 5 mg by mouth daily.   12  . Calcium Carbonate-Vit D-Min (CALTRATE 600+D PLUS MINERALS PO) Take 1 tablet by mouth daily.     . clopidogrel (PLAVIX) 75 MG tablet Take 75 mg by mouth daily.    Marland Kitchen donepezil (ARICEPT) 5 MG tablet Take 5 mg by mouth daily.    Marland Kitchen  EPINEPHrine 0.3 mg/0.3 mL IJ SOAJ injection Inject 0.3 mg into the muscle as needed (for bee stings).     Marland Kitchen HYDROcodone-acetaminophen (NORCO) 5-325 MG tablet Take 1 tablet by mouth every 6 (six) hours as needed for moderate pain. 30 tablet 0  . ketoconazole (NIZORAL) 2 % cream APPLY TO THE AFFECTED AREA(S) ON ABDOMEN AND BETWEEN LEGS FOLD TWICE DAILY    . levothyroxine (SYNTHROID, LEVOTHROID) 112 MCG tablet Take 112 mcg by mouth daily before breakfast.     . mupirocin ointment (BACTROBAN) 2 % APPLY TO BIOPSY SITES ON CHEST TWICE DAILY    . pantoprazole (PROTONIX) 40 MG tablet Take 40 mg by mouth daily.    . sertraline (ZOLOFT) 100 MG tablet Take  100 mg by mouth daily.      No current facility-administered medications for this visit.     Patient Active Problem List   Diagnosis Date Noted  . Fracture of left distal radius 10/14/2018  . Wrist fracture, closed, left, initial encounter 10/04/2018  . Melena 04/08/2018  . Absolute anemia 06/10/2017  . Chronic ischemic right MCA stroke   . Embolic stroke ([place]) 58/52/7782  . Breast cancer, left breast ([place]) 10/29/2011  . HYPOTHYROIDISM 01/15/2009  . DIABETES MELLITUS, TYPE II 01/15/2009  . HYPERCHOLESTEROLEMIA 01/15/2009  . DEPRESSION 01/15/2009  . DIVERTICULOSIS, COLON 01/15/2009  . DYSPHAGIA UNSPECIFIED 01/15/2009   Past Medical History:  Diagnosis Date  . Anemia   . Anxiety   . Cancer ([place])    left breast,  . Closed fracture of left distal radius   . Esophageal spasm    2011- Dr Delfin Edis  . GERD (gastroesophageal reflux disease)   . Hypertension    no meds  . Hypothyroidism   . Melena   . Stroke [facility])    no deficits  . Vision abnormalities     Family History  Problem Relation Age of Onset  . Heart disease Mother   . Diabetes Mother   . Cancer Mother        Theadora Rama    Past Surgical History:  Procedure Laterality Date  . BIOPSY  04/16/2018   Procedure: BIOPSY;  Surgeon: Rogene Houston, MD;  [facility];  Service: Endoscopy;;  gastric   . BREAST SURGERY  11/13/11   Lt parial mastectomy  . ESOPHAGOGASTRODUODENOSCOPY (EGD) WITH PROPOFOL N/A 04/16/2018   Procedure: ESOPHAGOGASTRODUODENOSCOPY (EGD) WITH PROPOFOL;  Surgeon: Rogene Houston, MD;  [facility];  Service: Endoscopy;  Laterality: N/A;  . left knee surgery    . OPEN REDUCTION INTERNAL FIXATION (ORIF) DISTAL RADIAL FRACTURE Left 10/07/2018   Procedure: LEFT OPEN REDUCTION INTERNAL FIXATION (ORIF) DISTAL RADIAL FRACTURE;  Surgeon: Garald Balding, MD;  Location: [place];  Service: Orthopedics;  Laterality: Left;  . TEE WITHOUT CARDIOVERSION N/A 05/28/2015    Procedure: TRANSESOPHAGEAL ECHOCARDIOGRAM (TEE);  Surgeon: Skeet Latch, MD;  Location: [place];  Service: Cardiovascular;  Laterality: N/A;   Social History   Occupational History  . Not on file  Tobacco Use  . Smoking status: Never Smoker  . Smokeless tobacco: Never Used  Substance and Sexual Activity  . Alcohol use: No  . Drug use: No  . Sexual activity: Not Currently    Birth control/protection: None

## 2018-10-21 ENCOUNTER — Ambulatory Visit: Payer: Self-pay | Admitting: Orthopaedic Surgery

## 2018-10-21 DIAGNOSIS — E119 Type 2 diabetes mellitus without complications: Secondary | ICD-10-CM | POA: Diagnosis not present

## 2018-10-21 DIAGNOSIS — E7800 Pure hypercholesterolemia, unspecified: Secondary | ICD-10-CM | POA: Diagnosis not present

## 2018-10-21 DIAGNOSIS — I639 Cerebral infarction, unspecified: Secondary | ICD-10-CM | POA: Diagnosis not present

## 2018-10-22 ENCOUNTER — Ambulatory Visit (INDEPENDENT_AMBULATORY_CARE_PROVIDER_SITE_OTHER): Payer: Medicare Other

## 2018-10-22 ENCOUNTER — Ambulatory Visit (INDEPENDENT_AMBULATORY_CARE_PROVIDER_SITE_OTHER): Payer: Medicare Other | Admitting: Orthopaedic Surgery

## 2018-10-22 ENCOUNTER — Other Ambulatory Visit: Payer: Self-pay

## 2018-10-22 ENCOUNTER — Encounter: Payer: Self-pay | Admitting: Orthopaedic Surgery

## 2018-10-22 VITALS — BP 155/69 | HR 68 | Ht 62.0 in | Wt 172.0 lb

## 2018-10-22 DIAGNOSIS — S52552D Other extraarticular fracture of lower end of left radius, subsequent encounter for closed fracture with routine healing: Secondary | ICD-10-CM

## 2018-10-22 DIAGNOSIS — M25532 Pain in left wrist: Secondary | ICD-10-CM

## 2018-10-22 NOTE — Addendum Note (Signed)
Addended by: Lendon Collar on: 10/22/2018 02:35 PM   Modules accepted: Orders

## 2018-10-22 NOTE — Progress Notes (Signed)
Office Visit Note   Patient: Crystal Rodriguez           Date of Birth: 15-Mar-1933           MRN: [id number] Visit Date: 10/22/2018              Requested by: Glenda Chroman, MD [place],  [place] [address] PCP: Glenda Chroman, MD   Assessment & Plan: Visit Diagnoses:  1. Pain in left wrist   2. Other closed extra-articular fracture of distal end of left radius with routine healing, subsequent encounter     Plan: 2 weeks status post open reduction internal fixation of a comminuted displaced left distal radius fracture.  Doing well.  Films reveal good position of the fracture.  Will wear volar wrist splint and start therapy in [place].  Office 2 weeks.  Stitches removed and Steri-Strips applied  Follow-Up Instructions: Return in about 2 weeks (around 11/05/2018).   Orders:  Orders Placed This Encounter  Procedures  . XR Wrist 2 Views Left   No orders of the defined types were placed in this encounter.     Procedures: No procedures performed   Clinical Data: No additional findings.   Subjective: Chief Complaint  Patient presents with  . Left Wrist - Follow-up    Left distal radius ORIF DOS 10/07/2018  Patient presents today for a two week follow up, following surgery. She had ORIF of her left distal radius fracture on 10/07/2018. She is in a volar splint. Patient states that she is doing well.   HPI  Review of Systems   Objective: Vital Signs: BP (!) 155/69   Pulse 68   Ht 5\' 2"  (1.575 m)   Wt 172 lb (78 kg)   BMI 31.46 kg/m   Physical Exam  Ortho Exam motor and sensory exam to left fingers.  Does have some degenerative changes at the MP and PIP joints and has some decreased motion.  Skin intact.  Wrist incision healing without problem.  Stitches removed and Steri-Strips applied.  No evidence of infection.  Motion of the wrist is limited as expected  Specialty Comments:  No specialty comments available.  Imaging: Xr Wrist 2 Views Left  Result Date:  10/22/2018 Films of the left wrist were obtained in several projections.  There is instrumentation after open reduction internal fixation of the distal radius.  There might be some minimal extrusion of 1 of the smooth pins into the wrist joint at the time of surgery we felt like they were well out of the joint.  Good position of the fracture that was comminuted    PMFS History: Patient Active Problem List   Diagnosis Date Noted  . Fracture of left distal radius 10/14/2018  . Wrist fracture, closed, left, initial encounter 10/04/2018  . Melena 04/08/2018  . Absolute anemia 06/10/2017  . Chronic ischemic right MCA stroke   . Embolic stroke ([place]) 27/78/2423  . Breast cancer, left breast ([place]) 10/29/2011  . HYPOTHYROIDISM 01/15/2009  . DIABETES MELLITUS, TYPE II 01/15/2009  . HYPERCHOLESTEROLEMIA 01/15/2009  . DEPRESSION 01/15/2009  . DIVERTICULOSIS, COLON 01/15/2009  . DYSPHAGIA UNSPECIFIED 01/15/2009   Past Medical History:  Diagnosis Date  . Anemia   . Anxiety   . Cancer ([place])    left breast,  . Closed fracture of left distal radius   . Esophageal spasm    2011- Dr Delfin Edis  . GERD (gastroesophageal reflux disease)   . Hypertension  no meds  . Hypothyroidism   . Melena   . Stroke [facility])    no deficits  . Vision abnormalities     Family History  Problem Relation Age of Onset  . Heart disease Mother   . Diabetes Mother   . Cancer Mother        Theadora Rama    Past Surgical History:  Procedure Laterality Date  . BIOPSY  04/16/2018   Procedure: BIOPSY;  Surgeon: Rogene Houston, MD;  [facility];  Service: Endoscopy;;  gastric   . BREAST SURGERY  11/13/11   Lt parial mastectomy  . ESOPHAGOGASTRODUODENOSCOPY (EGD) WITH PROPOFOL N/A 04/16/2018   Procedure: ESOPHAGOGASTRODUODENOSCOPY (EGD) WITH PROPOFOL;  Surgeon: Rogene Houston, MD;  [facility];  Service: Endoscopy;  Laterality: N/A;  . left knee surgery    . OPEN REDUCTION INTERNAL FIXATION  (ORIF) DISTAL RADIAL FRACTURE Left 10/07/2018   Procedure: LEFT OPEN REDUCTION INTERNAL FIXATION (ORIF) DISTAL RADIAL FRACTURE;  Surgeon: Garald Balding, MD;  Location: [place];  Service: Orthopedics;  Laterality: Left;  . TEE WITHOUT CARDIOVERSION N/A 05/28/2015   Procedure: TRANSESOPHAGEAL ECHOCARDIOGRAM (TEE);  Surgeon: Skeet Latch, MD;  Location: [place];  Service: Cardiovascular;  Laterality: N/A;   Social History   Occupational History  . Not on file  Tobacco Use  . Smoking status: Never Smoker  . Smokeless tobacco: Never Used  Substance and Sexual Activity  . Alcohol use: No  . Drug use: No  . Sexual activity: Not Currently    Birth control/protection: None

## 2018-10-25 ENCOUNTER — Telehealth: Payer: Self-pay | Admitting: Orthopaedic Surgery

## 2018-10-25 NOTE — Telephone Encounter (Signed)
Please call patient. Thank you.  

## 2018-10-25 NOTE — Telephone Encounter (Signed)
Patient called stating she has questions regarding cleaning her wrist and removing her wrist splint.  Patient is also requesting a referral to a different facility for physical therapy, but not able to remember the name.  Patient is requesting a return call and will provide the name then.

## 2018-10-26 NOTE — Telephone Encounter (Signed)
Spoke with patient. She is going to wash that area with soap and water. She is aware that she can remove the splint for showering, but needs to put it back on afterwards.

## 2018-10-27 ENCOUNTER — Other Ambulatory Visit: Payer: Self-pay | Admitting: Orthopaedic Surgery

## 2018-10-27 DIAGNOSIS — S52552D Other extraarticular fracture of lower end of left radius, subsequent encounter for closed fracture with routine healing: Secondary | ICD-10-CM

## 2018-11-02 DIAGNOSIS — M6281 Muscle weakness (generalized): Secondary | ICD-10-CM | POA: Diagnosis not present

## 2018-11-02 DIAGNOSIS — M25632 Stiffness of left wrist, not elsewhere classified: Secondary | ICD-10-CM | POA: Diagnosis not present

## 2018-11-02 DIAGNOSIS — S52552D Other extraarticular fracture of lower end of left radius, subsequent encounter for closed fracture with routine healing: Secondary | ICD-10-CM | POA: Diagnosis not present

## 2018-11-03 ENCOUNTER — Ambulatory Visit (INDEPENDENT_AMBULATORY_CARE_PROVIDER_SITE_OTHER): Payer: Medicare Other | Admitting: Orthopaedic Surgery

## 2018-11-03 ENCOUNTER — Encounter: Payer: Self-pay | Admitting: Orthopaedic Surgery

## 2018-11-03 ENCOUNTER — Other Ambulatory Visit: Payer: Self-pay

## 2018-11-03 VITALS — Ht 62.0 in | Wt 172.0 lb

## 2018-11-03 DIAGNOSIS — S52572D Other intraarticular fracture of lower end of left radius, subsequent encounter for closed fracture with routine healing: Secondary | ICD-10-CM

## 2018-11-03 NOTE — Progress Notes (Signed)
Office Visit Note   Patient: Crystal Rodriguez           Date of Birth: 04-29-1932           MRN: [id number] Visit Date: 11/03/2018              Requested by: Glenda Chroman, MD [place],  [place] [id number] PCP: Glenda Chroman, MD   Assessment & Plan: Visit Diagnoses:  1. Other closed intra-articular fracture of distal end of left radius with routine healing, subsequent encounter     Plan: 1 month status post ORIF right distal radius fracture and doing well.  Wears a volar wrist splint and attending physical therapy.  Minimal discomfort feeling much better about her range of motion.  Will return in 2 weeks and obtain new films.  Could be some settling of the osteoporotic bone around the plate Follow-Up Instructions: Return in about 2 weeks (around 11/17/2018).   Orders:  No orders of the defined types were placed in this encounter.  No orders of the defined types were placed in this encounter.     Procedures: No procedures performed   Clinical Data: No additional findings.   Subjective: Chief Complaint  Patient presents with  . Left Wrist - Routine Post Op    Left wrist ORIF DOS 10/07/2018  Patient presents today for a two week follow up on her left wrist. She is now 4 weeks out from left distal radius ORIF. Her surgery was on 10/07/2018. Doing well. Patient states that she has pain depending on how she turns her arm. She just started physical therapy.   HPI  Review of Systems   Objective: Vital Signs: Ht 5\' 2"  (1.575 m)   Wt 172 lb (78 kg)   BMI 31.46 kg/m   Physical Exam  Ortho Exam left wrist wound volarly healing without problem.  Good sensibility.  Had considerable stiffness when I saw her week or 2 ago in the office related to the arthritis in her hands but therapy has made a difference.  She cannot quite make a full fist but better than it was.  Good capillary refill to all of her fingers.  Has about 30 degrees of volar and dorsiflexion.  Almost full  pronation and supination.  Still has some tenderness over the fracture.  Slight radial deviation.  Specialty Comments:  No specialty comments available.  Imaging: No results found.   PMFS History: Patient Active Problem List   Diagnosis Date Noted  . Fracture of left distal radius 10/14/2018  . Wrist fracture, closed, left, initial encounter 10/04/2018  . Melena 04/08/2018  . Absolute anemia 06/10/2017  . Chronic ischemic right MCA stroke   . Embolic stroke ([place]) 01/75/1025  . Breast cancer, left breast ([place]) 10/29/2011  . HYPOTHYROIDISM 01/15/2009  . DIABETES MELLITUS, TYPE II 01/15/2009  . HYPERCHOLESTEROLEMIA 01/15/2009  . DEPRESSION 01/15/2009  . DIVERTICULOSIS, COLON 01/15/2009  . DYSPHAGIA UNSPECIFIED 01/15/2009   Past Medical History:  Diagnosis Date  . Anemia   . Anxiety   . Cancer ([place])    left breast,  . Closed fracture of left distal radius   . Esophageal spasm    2011- Dr Delfin Edis  . GERD (gastroesophageal reflux disease)   . Hypertension    no meds  . Hypothyroidism   . Melena   . Stroke [facility])    no deficits  . Vision abnormalities     Family History  Problem Relation Age of Onset  .  Heart disease Mother   . Diabetes Mother   . Cancer Mother        Theadora Rama    Past Surgical History:  Procedure Laterality Date  . BIOPSY  04/16/2018   Procedure: BIOPSY;  Surgeon: Rogene Houston, MD;  [facility];  Service: Endoscopy;;  gastric   . BREAST SURGERY  11/13/11   Lt parial mastectomy  . ESOPHAGOGASTRODUODENOSCOPY (EGD) WITH PROPOFOL N/A 04/16/2018   Procedure: ESOPHAGOGASTRODUODENOSCOPY (EGD) WITH PROPOFOL;  Surgeon: Rogene Houston, MD;  [facility];  Service: Endoscopy;  Laterality: N/A;  . left knee surgery    . OPEN REDUCTION INTERNAL FIXATION (ORIF) DISTAL RADIAL FRACTURE Left 10/07/2018   Procedure: LEFT OPEN REDUCTION INTERNAL FIXATION (ORIF) DISTAL RADIAL FRACTURE;  Surgeon: Garald Balding, MD;  Location: [place];  Service: Orthopedics;  Laterality: Left;  . TEE WITHOUT CARDIOVERSION N/A 05/28/2015   Procedure: TRANSESOPHAGEAL ECHOCARDIOGRAM (TEE);  Surgeon: Skeet Latch, MD;  Location: [place];  Service: Cardiovascular;  Laterality: N/A;   Social History   Occupational History  . Not on file  Tobacco Use  . Smoking status: Never Smoker  . Smokeless tobacco: Never Used  Substance and Sexual Activity  . Alcohol use: No  . Drug use: No  . Sexual activity: Not Currently    Birth control/protection: None

## 2018-11-04 DIAGNOSIS — M6281 Muscle weakness (generalized): Secondary | ICD-10-CM | POA: Diagnosis not present

## 2018-11-04 DIAGNOSIS — S52552D Other extraarticular fracture of lower end of left radius, subsequent encounter for closed fracture with routine healing: Secondary | ICD-10-CM | POA: Diagnosis not present

## 2018-11-04 DIAGNOSIS — M25632 Stiffness of left wrist, not elsewhere classified: Secondary | ICD-10-CM | POA: Diagnosis not present

## 2018-11-05 DIAGNOSIS — S52552D Other extraarticular fracture of lower end of left radius, subsequent encounter for closed fracture with routine healing: Secondary | ICD-10-CM | POA: Diagnosis not present

## 2018-11-05 DIAGNOSIS — M6281 Muscle weakness (generalized): Secondary | ICD-10-CM | POA: Diagnosis not present

## 2018-11-05 DIAGNOSIS — M25632 Stiffness of left wrist, not elsewhere classified: Secondary | ICD-10-CM | POA: Diagnosis not present

## 2018-11-08 DIAGNOSIS — M6281 Muscle weakness (generalized): Secondary | ICD-10-CM | POA: Diagnosis not present

## 2018-11-08 DIAGNOSIS — S52552D Other extraarticular fracture of lower end of left radius, subsequent encounter for closed fracture with routine healing: Secondary | ICD-10-CM | POA: Diagnosis not present

## 2018-11-08 DIAGNOSIS — M25632 Stiffness of left wrist, not elsewhere classified: Secondary | ICD-10-CM | POA: Diagnosis not present

## 2018-11-10 DIAGNOSIS — M25632 Stiffness of left wrist, not elsewhere classified: Secondary | ICD-10-CM | POA: Diagnosis not present

## 2018-11-10 DIAGNOSIS — S52552D Other extraarticular fracture of lower end of left radius, subsequent encounter for closed fracture with routine healing: Secondary | ICD-10-CM | POA: Diagnosis not present

## 2018-11-10 DIAGNOSIS — M6281 Muscle weakness (generalized): Secondary | ICD-10-CM | POA: Diagnosis not present

## 2018-11-11 ENCOUNTER — Other Ambulatory Visit (INDEPENDENT_AMBULATORY_CARE_PROVIDER_SITE_OTHER): Payer: Self-pay | Admitting: *Deleted

## 2018-11-11 DIAGNOSIS — D649 Anemia, unspecified: Secondary | ICD-10-CM

## 2018-11-11 DIAGNOSIS — K921 Melena: Secondary | ICD-10-CM

## 2018-11-12 DIAGNOSIS — M25632 Stiffness of left wrist, not elsewhere classified: Secondary | ICD-10-CM | POA: Diagnosis not present

## 2018-11-12 DIAGNOSIS — M6281 Muscle weakness (generalized): Secondary | ICD-10-CM | POA: Diagnosis not present

## 2018-11-12 DIAGNOSIS — S52552D Other extraarticular fracture of lower end of left radius, subsequent encounter for closed fracture with routine healing: Secondary | ICD-10-CM | POA: Diagnosis not present

## 2018-11-15 DIAGNOSIS — M6281 Muscle weakness (generalized): Secondary | ICD-10-CM | POA: Diagnosis not present

## 2018-11-15 DIAGNOSIS — S52552D Other extraarticular fracture of lower end of left radius, subsequent encounter for closed fracture with routine healing: Secondary | ICD-10-CM | POA: Diagnosis not present

## 2018-11-15 DIAGNOSIS — M25632 Stiffness of left wrist, not elsewhere classified: Secondary | ICD-10-CM | POA: Diagnosis not present

## 2018-11-17 ENCOUNTER — Ambulatory Visit (INDEPENDENT_AMBULATORY_CARE_PROVIDER_SITE_OTHER): Payer: Medicare Other

## 2018-11-17 ENCOUNTER — Ambulatory Visit (INDEPENDENT_AMBULATORY_CARE_PROVIDER_SITE_OTHER): Payer: Medicare Other | Admitting: Orthopaedic Surgery

## 2018-11-17 ENCOUNTER — Encounter: Payer: Self-pay | Admitting: Orthopaedic Surgery

## 2018-11-17 ENCOUNTER — Other Ambulatory Visit: Payer: Self-pay

## 2018-11-17 VITALS — BP 128/75 | HR 74 | Wt 172.0 lb

## 2018-11-17 DIAGNOSIS — S52572D Other intraarticular fracture of lower end of left radius, subsequent encounter for closed fracture with routine healing: Secondary | ICD-10-CM | POA: Diagnosis not present

## 2018-11-17 DIAGNOSIS — M25632 Stiffness of left wrist, not elsewhere classified: Secondary | ICD-10-CM | POA: Diagnosis not present

## 2018-11-17 DIAGNOSIS — L280 Lichen simplex chronicus: Secondary | ICD-10-CM | POA: Diagnosis not present

## 2018-11-17 DIAGNOSIS — M6281 Muscle weakness (generalized): Secondary | ICD-10-CM | POA: Diagnosis not present

## 2018-11-17 DIAGNOSIS — L570 Actinic keratosis: Secondary | ICD-10-CM | POA: Diagnosis not present

## 2018-11-17 DIAGNOSIS — S52552D Other extraarticular fracture of lower end of left radius, subsequent encounter for closed fracture with routine healing: Secondary | ICD-10-CM | POA: Diagnosis not present

## 2018-11-17 NOTE — Progress Notes (Signed)
Office Visit Note   Patient: Crystal Rodriguez           Date of Birth: 1932/04/17           MRN: [id number] Visit Date: 11/17/2018              Requested by: Glenda Chroman, MD [place],  [place] [address] PCP: Glenda Chroman, MD   Assessment & Plan: Visit Diagnoses:  1. Other closed intra-articular fracture of distal end of left radius with routine healing, subsequent encounter     Plan: 6 weeks status post ORIF of left distal radius fracture and doing well.  Has regained full pronation supination and functional dorsiflexion and volar flexion of her wrist.  Does have significant degenerative changes in her fingers which does not allow her to make a full fist.  Neurologically intact.  Films do not reveal any change.  There is still some shortening and there may be some prominence of several of the small pins into the joint but there is no loss of motion or crepitation.  May discontinue the splint and continue therapy would like to see her again in 3 to 4 weeks.  If the plate should become symptomatic we could consider removing it.  There was considerable osteoporosis and I did supplement the compressed bone in the metaphysis with cortical cancellus chips  Follow-Up Instructions: Return in about 3 weeks (around 12/08/2018).   Orders:  Orders Placed This Encounter  Procedures  . XR Wrist 2 Views Left   No orders of the defined types were placed in this encounter.     Procedures: No procedures performed   Clinical Data: No additional findings.   Subjective: Chief Complaint  Patient presents with  . Left Wrist - Follow-up  Patient presents today for a two week follow up on her left wrist. She is now 6 weeks out from ORIF left wrist. Her surgery was on 10/07/2018. She is doing well, but states that she cannot make a fist. She is going to outpatient therapy three times weekly and wearing her splint.   HPI  Review of Systems   Objective: Vital Signs: BP 128/75   Pulse 74    Wt 172 lb (78 kg)   BMI 31.46 kg/m   Physical Exam  Ortho Exam left wrist incision is healing without problem.  Neurologically intact.  Does have hypertrophic changes about the PIP and DIP joints of the left fingers diffusely which limits her ability make a full fist.  Had full pronation and supination of her left wrist and had about 30 degrees of dorsiflexion and volar flexion.  No significant wrist pain to palpation or to motion  Specialty Comments:  No specialty comments available.  Imaging: Xr Wrist 2 Views Left  Result Date: 11/17/2018 Films of the left wrist were obtained in 3 projections.  There is been prior open reduction internal fixation of the distal radius fracture.  Fracture is in good position compared to prior films.  There may be some receding of the bone around the plate and wanted to of the smooth pins might be prominent into the joint.  Clinically there is no problem.  There appears to be some callus.    PMFS History: Patient Active Problem List   Diagnosis Date Noted  . Fracture of left distal radius 10/14/2018  . Wrist fracture, closed, left, initial encounter 10/04/2018  . Melena 04/08/2018  . Absolute anemia 06/10/2017  . Chronic ischemic right MCA stroke   .  Embolic stroke ([place]) 99991111  . Breast cancer, left breast ([place]) 10/29/2011  . HYPOTHYROIDISM 01/15/2009  . DIABETES MELLITUS, TYPE II 01/15/2009  . HYPERCHOLESTEROLEMIA 01/15/2009  . DEPRESSION 01/15/2009  . DIVERTICULOSIS, COLON 01/15/2009  . DYSPHAGIA UNSPECIFIED 01/15/2009   Past Medical History:  Diagnosis Date  . Anemia   . Anxiety   . Cancer ([employer])    left breast,  . Closed fracture of left distal radius   . Esophageal spasm    2011- Dr Delfin Edis  . GERD (gastroesophageal reflux disease)   . Hypertension    no meds  . Hypothyroidism   . Melena   . Stroke [facility])    no deficits  . Vision abnormalities     Family History  Problem Relation Age of Onset  . Heart disease  Mother   . Diabetes Mother   . Cancer Mother        Theadora Rama    Past Surgical History:  Procedure Laterality Date  . BIOPSY  04/16/2018   Procedure: BIOPSY;  Surgeon: Rogene Houston, MD;  [facility];  Service: Endoscopy;;  gastric   . BREAST SURGERY  11/13/11   Lt parial mastectomy  . ESOPHAGOGASTRODUODENOSCOPY (EGD) WITH PROPOFOL N/A 04/16/2018   Procedure: ESOPHAGOGASTRODUODENOSCOPY (EGD) WITH PROPOFOL;  Surgeon: Rogene Houston, MD;  [facility];  Service: Endoscopy;  Laterality: N/A;  . left knee surgery    . OPEN REDUCTION INTERNAL FIXATION (ORIF) DISTAL RADIAL FRACTURE Left 10/07/2018   Procedure: LEFT OPEN REDUCTION INTERNAL FIXATION (ORIF) DISTAL RADIAL FRACTURE;  Surgeon: Garald Balding, MD;  Location: [place];  Service: Orthopedics;  Laterality: Left;  . TEE WITHOUT CARDIOVERSION N/A 05/28/2015   Procedure: TRANSESOPHAGEAL ECHOCARDIOGRAM (TEE);  Surgeon: Skeet Latch, MD;  Location: [place];  Service: Cardiovascular;  Laterality: N/A;   Social History   Occupational History  . Not on file  Tobacco Use  . Smoking status: Never Smoker  . Smokeless tobacco: Never Used  Substance and Sexual Activity  . Alcohol use: No  . Drug use: No  . Sexual activity: Not Currently    Birth control/protection: None

## 2018-11-18 DIAGNOSIS — E119 Type 2 diabetes mellitus without complications: Secondary | ICD-10-CM | POA: Diagnosis not present

## 2018-11-18 DIAGNOSIS — I639 Cerebral infarction, unspecified: Secondary | ICD-10-CM | POA: Diagnosis not present

## 2018-11-18 DIAGNOSIS — E7800 Pure hypercholesterolemia, unspecified: Secondary | ICD-10-CM | POA: Diagnosis not present

## 2018-11-24 DIAGNOSIS — M6281 Muscle weakness (generalized): Secondary | ICD-10-CM | POA: Diagnosis not present

## 2018-11-24 DIAGNOSIS — M25632 Stiffness of left wrist, not elsewhere classified: Secondary | ICD-10-CM | POA: Diagnosis not present

## 2018-11-24 DIAGNOSIS — S52552D Other extraarticular fracture of lower end of left radius, subsequent encounter for closed fracture with routine healing: Secondary | ICD-10-CM | POA: Diagnosis not present

## 2018-11-26 DIAGNOSIS — S52552D Other extraarticular fracture of lower end of left radius, subsequent encounter for closed fracture with routine healing: Secondary | ICD-10-CM | POA: Diagnosis not present

## 2018-11-26 DIAGNOSIS — M6281 Muscle weakness (generalized): Secondary | ICD-10-CM | POA: Diagnosis not present

## 2018-11-26 DIAGNOSIS — M25632 Stiffness of left wrist, not elsewhere classified: Secondary | ICD-10-CM | POA: Diagnosis not present

## 2018-11-30 DIAGNOSIS — S52552D Other extraarticular fracture of lower end of left radius, subsequent encounter for closed fracture with routine healing: Secondary | ICD-10-CM | POA: Diagnosis not present

## 2018-11-30 DIAGNOSIS — M25632 Stiffness of left wrist, not elsewhere classified: Secondary | ICD-10-CM | POA: Diagnosis not present

## 2018-11-30 DIAGNOSIS — M6281 Muscle weakness (generalized): Secondary | ICD-10-CM | POA: Diagnosis not present

## 2018-12-02 DIAGNOSIS — M6281 Muscle weakness (generalized): Secondary | ICD-10-CM | POA: Diagnosis not present

## 2018-12-02 DIAGNOSIS — M25632 Stiffness of left wrist, not elsewhere classified: Secondary | ICD-10-CM | POA: Diagnosis not present

## 2018-12-02 DIAGNOSIS — S52552D Other extraarticular fracture of lower end of left radius, subsequent encounter for closed fracture with routine healing: Secondary | ICD-10-CM | POA: Diagnosis not present

## 2018-12-03 DIAGNOSIS — M25632 Stiffness of left wrist, not elsewhere classified: Secondary | ICD-10-CM | POA: Diagnosis not present

## 2018-12-03 DIAGNOSIS — S52552D Other extraarticular fracture of lower end of left radius, subsequent encounter for closed fracture with routine healing: Secondary | ICD-10-CM | POA: Diagnosis not present

## 2018-12-03 DIAGNOSIS — M6281 Muscle weakness (generalized): Secondary | ICD-10-CM | POA: Diagnosis not present

## 2018-12-06 DIAGNOSIS — M6281 Muscle weakness (generalized): Secondary | ICD-10-CM | POA: Diagnosis not present

## 2018-12-06 DIAGNOSIS — M25632 Stiffness of left wrist, not elsewhere classified: Secondary | ICD-10-CM | POA: Diagnosis not present

## 2018-12-06 DIAGNOSIS — S52552D Other extraarticular fracture of lower end of left radius, subsequent encounter for closed fracture with routine healing: Secondary | ICD-10-CM | POA: Diagnosis not present

## 2018-12-09 DIAGNOSIS — M6281 Muscle weakness (generalized): Secondary | ICD-10-CM | POA: Diagnosis not present

## 2018-12-09 DIAGNOSIS — S52552D Other extraarticular fracture of lower end of left radius, subsequent encounter for closed fracture with routine healing: Secondary | ICD-10-CM | POA: Diagnosis not present

## 2018-12-09 DIAGNOSIS — M25632 Stiffness of left wrist, not elsewhere classified: Secondary | ICD-10-CM | POA: Diagnosis not present

## 2018-12-13 DIAGNOSIS — M25632 Stiffness of left wrist, not elsewhere classified: Secondary | ICD-10-CM | POA: Diagnosis not present

## 2018-12-13 DIAGNOSIS — S52552D Other extraarticular fracture of lower end of left radius, subsequent encounter for closed fracture with routine healing: Secondary | ICD-10-CM | POA: Diagnosis not present

## 2018-12-13 DIAGNOSIS — M6281 Muscle weakness (generalized): Secondary | ICD-10-CM | POA: Diagnosis not present

## 2018-12-15 ENCOUNTER — Encounter: Payer: Self-pay | Admitting: Orthopaedic Surgery

## 2018-12-15 ENCOUNTER — Other Ambulatory Visit: Payer: Self-pay

## 2018-12-15 ENCOUNTER — Ambulatory Visit (INDEPENDENT_AMBULATORY_CARE_PROVIDER_SITE_OTHER): Payer: Medicare Other | Admitting: Orthopaedic Surgery

## 2018-12-15 VITALS — BP 132/66 | HR 64 | Ht 62.0 in | Wt 172.0 lb

## 2018-12-15 DIAGNOSIS — S52552D Other extraarticular fracture of lower end of left radius, subsequent encounter for closed fracture with routine healing: Secondary | ICD-10-CM | POA: Diagnosis not present

## 2018-12-15 DIAGNOSIS — M25632 Stiffness of left wrist, not elsewhere classified: Secondary | ICD-10-CM | POA: Diagnosis not present

## 2018-12-15 DIAGNOSIS — M6281 Muscle weakness (generalized): Secondary | ICD-10-CM | POA: Diagnosis not present

## 2018-12-15 DIAGNOSIS — S52572D Other intraarticular fracture of lower end of left radius, subsequent encounter for closed fracture with routine healing: Secondary | ICD-10-CM

## 2018-12-15 NOTE — Progress Notes (Signed)
Office Visit Note   Patient: Crystal Rodriguez           Date of Birth: September 13, 1932           MRN: [id number] Visit Date: 12/15/2018              Requested by: Glenda Chroman, MD [place],  [place] [id number] PCP: Glenda Chroman, MD   Assessment & Plan: Visit Diagnoses:  1. Other closed intra-articular fracture of distal end of left radius with routine healing, subsequent encounter     Plan: Status post open reduction internal fixation of left distal radius fracture on 10/07/2018.  Doing quite well.  Continues to go to physical therapy for range of motion of her fingers.  Has considerable degenerative change in all of her digits but is making good progress.  No appreciable wrist pain.  We will check her back one more time in a month.  Follow-Up Instructions: Return in about 1 month (around 01/14/2019).   Orders:  No orders of the defined types were placed in this encounter.  No orders of the defined types were placed in this encounter.     Procedures: No procedures performed   Clinical Data: No additional findings.   Subjective: Chief Complaint  Patient presents with  . Left Wrist - Routine Post Op    Left wrist ORIF DOS 10/07/2018  Patient presents today for a follow up on her left wrist. She had ORIF for a left wrist fracture on 10/07/2018. She said that she is doing well. She goes to therapy three times a week.   HPI  Review of Systems   Objective: Vital Signs: BP 132/66   Pulse 64   Ht 5\' 2"  (1.575 m)   Wt 172 lb (78 kg)   BMI 31.46 kg/m   Physical Exam  Ortho Exam left wrist with full pronation supination flexion extension compared to her right wrist.  Does have slight radial deviation.  No localized areas of tenderness.  Wound is healed without problem.  Skin intact.  Lacks about a fingerbreadth from touching the tips of her fingers to the palm of her hand which is a considerable improvement  Specialty Comments:  No specialty comments available.   Imaging: No results found.   PMFS History: Patient Active Problem List   Diagnosis Date Noted  . Fracture of left distal radius 10/14/2018  . Wrist fracture, closed, left, initial encounter 10/04/2018  . Melena 04/08/2018  . Absolute anemia 06/10/2017  . Chronic ischemic right MCA stroke   . Embolic stroke ([place]) 99991111  . Breast cancer, left breast ([place]) 10/29/2011  . HYPOTHYROIDISM 01/15/2009  . DIABETES MELLITUS, TYPE II 01/15/2009  . HYPERCHOLESTEROLEMIA 01/15/2009  . DEPRESSION 01/15/2009  . DIVERTICULOSIS, COLON 01/15/2009  . DYSPHAGIA UNSPECIFIED 01/15/2009   Past Medical History:  Diagnosis Date  . Anemia   . Anxiety   . Cancer ([place])    left breast,  . Closed fracture of left distal radius   . Esophageal spasm    2011- Dr Delfin Edis  . GERD (gastroesophageal reflux disease)   . Hypertension    no meds  . Hypothyroidism   . Melena   . Stroke [facility])    no deficits  . Vision abnormalities     Family History  Problem Relation Age of Onset  . Heart disease Mother   . Diabetes Mother   . Cancer Mother        Theadora Rama  Past Surgical History:  Procedure Laterality Date  . BIOPSY  04/16/2018   Procedure: BIOPSY;  Surgeon: Rogene Houston, MD;  [facility];  Service: Endoscopy;;  gastric   . BREAST SURGERY  11/13/11   Lt parial mastectomy  . ESOPHAGOGASTRODUODENOSCOPY (EGD) WITH PROPOFOL N/A 04/16/2018   Procedure: ESOPHAGOGASTRODUODENOSCOPY (EGD) WITH PROPOFOL;  Surgeon: Rogene Houston, MD;  [facility];  Service: Endoscopy;  Laterality: N/A;  . left knee surgery    . OPEN REDUCTION INTERNAL FIXATION (ORIF) DISTAL RADIAL FRACTURE Left 10/07/2018   Procedure: LEFT OPEN REDUCTION INTERNAL FIXATION (ORIF) DISTAL RADIAL FRACTURE;  Surgeon: Garald Balding, MD;  Location: [place];  Service: Orthopedics;  Laterality: Left;  . TEE WITHOUT CARDIOVERSION N/A 05/28/2015   Procedure: TRANSESOPHAGEAL ECHOCARDIOGRAM (TEE);   Surgeon: Skeet Latch, MD;  Location: [place];  Service: Cardiovascular;  Laterality: N/A;   Social History   Occupational History  . Not on file  Tobacco Use  . Smoking status: Never Smoker  . Smokeless tobacco: Never Used  Substance and Sexual Activity  . Alcohol use: No  . Drug use: No  . Sexual activity: Not Currently    Birth control/protection: None

## 2018-12-17 ENCOUNTER — Ambulatory Visit: Payer: Medicare Other | Admitting: Cardiovascular Disease

## 2018-12-23 DIAGNOSIS — Z1339 Encounter for screening examination for other mental health and behavioral disorders: Secondary | ICD-10-CM | POA: Diagnosis not present

## 2018-12-23 DIAGNOSIS — E039 Hypothyroidism, unspecified: Secondary | ICD-10-CM | POA: Diagnosis not present

## 2018-12-23 DIAGNOSIS — E7800 Pure hypercholesterolemia, unspecified: Secondary | ICD-10-CM | POA: Diagnosis not present

## 2018-12-23 DIAGNOSIS — Z79899 Other long term (current) drug therapy: Secondary | ICD-10-CM | POA: Diagnosis not present

## 2018-12-23 DIAGNOSIS — Z299 Encounter for prophylactic measures, unspecified: Secondary | ICD-10-CM | POA: Diagnosis not present

## 2018-12-23 DIAGNOSIS — I10 Essential (primary) hypertension: Secondary | ICD-10-CM | POA: Diagnosis not present

## 2018-12-23 DIAGNOSIS — Z1331 Encounter for screening for depression: Secondary | ICD-10-CM | POA: Diagnosis not present

## 2018-12-23 DIAGNOSIS — Z7189 Other specified counseling: Secondary | ICD-10-CM | POA: Diagnosis not present

## 2018-12-23 DIAGNOSIS — E1165 Type 2 diabetes mellitus with hyperglycemia: Secondary | ICD-10-CM | POA: Diagnosis not present

## 2018-12-23 DIAGNOSIS — Z6831 Body mass index (BMI) 31.0-31.9, adult: Secondary | ICD-10-CM | POA: Diagnosis not present

## 2018-12-23 DIAGNOSIS — Z0000 Encounter for general adult medical examination without abnormal findings: Secondary | ICD-10-CM | POA: Diagnosis not present

## 2018-12-23 DIAGNOSIS — R5383 Other fatigue: Secondary | ICD-10-CM | POA: Diagnosis not present

## 2018-12-23 DIAGNOSIS — Z1211 Encounter for screening for malignant neoplasm of colon: Secondary | ICD-10-CM | POA: Diagnosis not present

## 2019-01-05 DIAGNOSIS — Z6831 Body mass index (BMI) 31.0-31.9, adult: Secondary | ICD-10-CM | POA: Diagnosis not present

## 2019-01-05 DIAGNOSIS — E1165 Type 2 diabetes mellitus with hyperglycemia: Secondary | ICD-10-CM | POA: Diagnosis not present

## 2019-01-05 DIAGNOSIS — F0391 Unspecified dementia with behavioral disturbance: Secondary | ICD-10-CM | POA: Diagnosis not present

## 2019-01-05 DIAGNOSIS — R300 Dysuria: Secondary | ICD-10-CM | POA: Diagnosis not present

## 2019-01-05 DIAGNOSIS — N390 Urinary tract infection, site not specified: Secondary | ICD-10-CM | POA: Diagnosis not present

## 2019-01-05 DIAGNOSIS — I10 Essential (primary) hypertension: Secondary | ICD-10-CM | POA: Diagnosis not present

## 2019-01-05 DIAGNOSIS — Z299 Encounter for prophylactic measures, unspecified: Secondary | ICD-10-CM | POA: Diagnosis not present

## 2019-01-06 DIAGNOSIS — Z23 Encounter for immunization: Secondary | ICD-10-CM | POA: Diagnosis not present

## 2019-01-12 ENCOUNTER — Ambulatory Visit (INDEPENDENT_AMBULATORY_CARE_PROVIDER_SITE_OTHER): Payer: Medicare Other | Admitting: Orthopaedic Surgery

## 2019-01-12 ENCOUNTER — Encounter: Payer: Self-pay | Admitting: Orthopaedic Surgery

## 2019-01-12 ENCOUNTER — Other Ambulatory Visit: Payer: Self-pay

## 2019-01-12 VITALS — Ht 62.0 in | Wt 172.0 lb

## 2019-01-12 DIAGNOSIS — S52572D Other intraarticular fracture of lower end of left radius, subsequent encounter for closed fracture with routine healing: Secondary | ICD-10-CM | POA: Diagnosis not present

## 2019-01-12 DIAGNOSIS — S62102A Fracture of unspecified carpal bone, left wrist, initial encounter for closed fracture: Secondary | ICD-10-CM

## 2019-01-12 NOTE — Progress Notes (Signed)
Office Visit Note   Patient: Crystal Rodriguez           Date of Birth: Jan 20, 1933           MRN: [id number] Visit Date: 01/12/2019              Requested by: Glenda Chroman, MD [place],  [place] [id number] PCP: Glenda Chroman, MD   Assessment & Plan: Visit Diagnoses:  1. Wrist fracture, closed, left, initial encounter   2. Other closed intra-articular fracture of distal end of left radius with routine healing, subsequent encounter     Plan: Over 3 months status post open reduction internal fixation of left distal radius fracture and doing well.  She has regained full motion.  Occasionally has some achiness and soreness.  Still having some weakness.  Will work on her strength.  Plan to see her back as needed.  Follow-Up Instructions: Return if symptoms worsen or fail to improve.   Orders:  No orders of the defined types were placed in this encounter.  No orders of the defined types were placed in this encounter.     Procedures: No procedures performed   Clinical Data: No additional findings.   Subjective: Chief Complaint  Patient presents with  . Left Wrist - Follow-up    ORIF Left distal radius fracture DOS 10/07/2018  Patient presents today for a one month follow up on her wrist. She had ORIF of her distal radius fracture on 10/07/2018.  No related fever or chills.  Still feels like she is weak on the left side.  Much better motion of her hands related to her arthritis.  On occasion has some tingling into her radial 3 digits and might have early carpal tunnel syndrome.  Not having compromise of her activities "yet" HPI  Review of Systems   Objective: Vital Signs: Ht 5\' 2"  (1.575 m)   Wt 172 lb (78 kg)   BMI 31.46 kg/m   Physical Exam Constitutional:      Appearance: She is well-developed.  Eyes:     Pupils: Pupils are equal, round, and reactive to light.  Pulmonary:     Effort: Pulmonary effort is normal.  Skin:    General: Skin is warm and dry.   Neurological:     Mental Status: She is alert and oriented to person, place, and time.  Psychiatric:        Behavior: Behavior normal.     Ortho Exam has issue with her balance.  Also has some reddened skin lesions in both of her legs more on the right than the left and being followed by her primary care physician.  In terms of her left wrist her incision is healed nicely.  She is almost able to make a full fist which is significantly better than at her last visit.  Incision is healed without problem.  She has a little bit of radial deviation but has full pronation supination flexion extension of her wrist with no pain.  Specialty Comments:  No specialty comments available.  Imaging: No results found.   PMFS History: Patient Active Problem List   Diagnosis Date Noted  . Fracture of left distal radius 10/14/2018  . Wrist fracture, closed, left, initial encounter 10/04/2018  . Melena 04/08/2018  . Absolute anemia 06/10/2017  . Chronic ischemic right MCA stroke   . Embolic stroke ([place]) 99991111  . Breast cancer, left breast ([place]) 10/29/2011  . HYPOTHYROIDISM 01/15/2009  . DIABETES MELLITUS,  TYPE II 01/15/2009  . HYPERCHOLESTEROLEMIA 01/15/2009  . DEPRESSION 01/15/2009  . DIVERTICULOSIS, COLON 01/15/2009  . DYSPHAGIA UNSPECIFIED 01/15/2009   Past Medical History:  Diagnosis Date  . Anemia   . Anxiety   . Cancer ([place])    left breast,  . Closed fracture of left distal radius   . Esophageal spasm    2011- Dr Delfin Edis  . GERD (gastroesophageal reflux disease)   . Hypertension    no meds  . Hypothyroidism   . Melena   . Stroke [facility])    no deficits  . Vision abnormalities     Family History  Problem Relation Age of Onset  . Heart disease Mother   . Diabetes Mother   . Cancer Mother        Theadora Rama    Past Surgical History:  Procedure Laterality Date  . BIOPSY  04/16/2018   Procedure: BIOPSY;  Surgeon: Rogene Houston, MD;  [facility];  Service:  Endoscopy;;  gastric   . BREAST SURGERY  11/13/11   Lt parial mastectomy  . ESOPHAGOGASTRODUODENOSCOPY (EGD) WITH PROPOFOL N/A 04/16/2018   Procedure: ESOPHAGOGASTRODUODENOSCOPY (EGD) WITH PROPOFOL;  Surgeon: Rogene Houston, MD;  [facility];  Service: Endoscopy;  Laterality: N/A;  . left knee surgery    . OPEN REDUCTION INTERNAL FIXATION (ORIF) DISTAL RADIAL FRACTURE Left 10/07/2018   Procedure: LEFT OPEN REDUCTION INTERNAL FIXATION (ORIF) DISTAL RADIAL FRACTURE;  Surgeon: Garald Balding, MD;  Location: [place];  Service: Orthopedics;  Laterality: Left;  . TEE WITHOUT CARDIOVERSION N/A 05/28/2015   Procedure: TRANSESOPHAGEAL ECHOCARDIOGRAM (TEE);  Surgeon: Skeet Latch, MD;  Location: [place];  Service: Cardiovascular;  Laterality: N/A;   Social History   Occupational History  . Not on file  Tobacco Use  . Smoking status: Never Smoker  . Smokeless tobacco: Never Used  Substance and Sexual Activity  . Alcohol use: No  . Drug use: No  . Sexual activity: Not Currently    Birth control/protection: None

## 2019-02-21 ENCOUNTER — Ambulatory Visit: Payer: Medicare Other | Admitting: Cardiovascular Disease

## 2019-03-17 DIAGNOSIS — I639 Cerebral infarction, unspecified: Secondary | ICD-10-CM | POA: Diagnosis not present

## 2019-03-17 DIAGNOSIS — E119 Type 2 diabetes mellitus without complications: Secondary | ICD-10-CM | POA: Diagnosis not present

## 2019-03-17 DIAGNOSIS — E7800 Pure hypercholesterolemia, unspecified: Secondary | ICD-10-CM | POA: Diagnosis not present

## 2019-03-22 DIAGNOSIS — S338XXA Sprain of other parts of lumbar spine and pelvis, initial encounter: Secondary | ICD-10-CM | POA: Diagnosis not present

## 2019-03-22 DIAGNOSIS — S134XXA Sprain of ligaments of cervical spine, initial encounter: Secondary | ICD-10-CM | POA: Diagnosis not present

## 2019-03-22 DIAGNOSIS — M9901 Segmental and somatic dysfunction of cervical region: Secondary | ICD-10-CM | POA: Diagnosis not present

## 2019-03-22 DIAGNOSIS — S233XXA Sprain of ligaments of thoracic spine, initial encounter: Secondary | ICD-10-CM | POA: Diagnosis not present

## 2019-03-22 DIAGNOSIS — M9902 Segmental and somatic dysfunction of thoracic region: Secondary | ICD-10-CM | POA: Diagnosis not present

## 2019-03-22 DIAGNOSIS — M9903 Segmental and somatic dysfunction of lumbar region: Secondary | ICD-10-CM | POA: Diagnosis not present

## 2019-03-29 DIAGNOSIS — S233XXA Sprain of ligaments of thoracic spine, initial encounter: Secondary | ICD-10-CM | POA: Diagnosis not present

## 2019-03-29 DIAGNOSIS — M9902 Segmental and somatic dysfunction of thoracic region: Secondary | ICD-10-CM | POA: Diagnosis not present

## 2019-03-29 DIAGNOSIS — M9903 Segmental and somatic dysfunction of lumbar region: Secondary | ICD-10-CM | POA: Diagnosis not present

## 2019-03-29 DIAGNOSIS — S338XXA Sprain of other parts of lumbar spine and pelvis, initial encounter: Secondary | ICD-10-CM | POA: Diagnosis not present

## 2019-03-29 DIAGNOSIS — S134XXA Sprain of ligaments of cervical spine, initial encounter: Secondary | ICD-10-CM | POA: Diagnosis not present

## 2019-03-29 DIAGNOSIS — M9901 Segmental and somatic dysfunction of cervical region: Secondary | ICD-10-CM | POA: Diagnosis not present

## 2019-03-31 DIAGNOSIS — Z299 Encounter for prophylactic measures, unspecified: Secondary | ICD-10-CM | POA: Diagnosis not present

## 2019-03-31 DIAGNOSIS — I10 Essential (primary) hypertension: Secondary | ICD-10-CM | POA: Diagnosis not present

## 2019-03-31 DIAGNOSIS — D692 Other nonthrombocytopenic purpura: Secondary | ICD-10-CM | POA: Diagnosis not present

## 2019-03-31 DIAGNOSIS — E1165 Type 2 diabetes mellitus with hyperglycemia: Secondary | ICD-10-CM | POA: Diagnosis not present

## 2019-03-31 DIAGNOSIS — Z789 Other specified health status: Secondary | ICD-10-CM | POA: Diagnosis not present

## 2019-03-31 DIAGNOSIS — F0390 Unspecified dementia without behavioral disturbance: Secondary | ICD-10-CM | POA: Diagnosis not present

## 2019-03-31 DIAGNOSIS — Z6831 Body mass index (BMI) 31.0-31.9, adult: Secondary | ICD-10-CM | POA: Diagnosis not present

## 2019-04-01 DIAGNOSIS — S338XXA Sprain of other parts of lumbar spine and pelvis, initial encounter: Secondary | ICD-10-CM | POA: Diagnosis not present

## 2019-04-01 DIAGNOSIS — M9903 Segmental and somatic dysfunction of lumbar region: Secondary | ICD-10-CM | POA: Diagnosis not present

## 2019-04-01 DIAGNOSIS — S233XXA Sprain of ligaments of thoracic spine, initial encounter: Secondary | ICD-10-CM | POA: Diagnosis not present

## 2019-04-01 DIAGNOSIS — M9902 Segmental and somatic dysfunction of thoracic region: Secondary | ICD-10-CM | POA: Diagnosis not present

## 2019-04-01 DIAGNOSIS — M9901 Segmental and somatic dysfunction of cervical region: Secondary | ICD-10-CM | POA: Diagnosis not present

## 2019-04-01 DIAGNOSIS — S134XXA Sprain of ligaments of cervical spine, initial encounter: Secondary | ICD-10-CM | POA: Diagnosis not present

## 2019-04-11 DIAGNOSIS — D492 Neoplasm of unspecified behavior of bone, soft tissue, and skin: Secondary | ICD-10-CM | POA: Diagnosis not present

## 2019-04-20 DIAGNOSIS — C44729 Squamous cell carcinoma of skin of left lower limb, including hip: Secondary | ICD-10-CM | POA: Diagnosis not present

## 2019-04-20 DIAGNOSIS — Z7902 Long term (current) use of antithrombotics/antiplatelets: Secondary | ICD-10-CM | POA: Diagnosis not present

## 2019-04-20 DIAGNOSIS — Z880 Allergy status to penicillin: Secondary | ICD-10-CM | POA: Diagnosis not present

## 2019-04-20 DIAGNOSIS — Z01818 Encounter for other preprocedural examination: Secondary | ICD-10-CM | POA: Diagnosis not present

## 2019-04-20 DIAGNOSIS — I10 Essential (primary) hypertension: Secondary | ICD-10-CM | POA: Diagnosis not present

## 2019-04-20 DIAGNOSIS — E7800 Pure hypercholesterolemia, unspecified: Secondary | ICD-10-CM | POA: Diagnosis not present

## 2019-04-20 DIAGNOSIS — Z853 Personal history of malignant neoplasm of breast: Secondary | ICD-10-CM | POA: Diagnosis not present

## 2019-04-20 DIAGNOSIS — Z882 Allergy status to sulfonamides status: Secondary | ICD-10-CM | POA: Diagnosis not present

## 2019-04-20 DIAGNOSIS — E039 Hypothyroidism, unspecified: Secondary | ICD-10-CM | POA: Diagnosis not present

## 2019-04-20 DIAGNOSIS — Z79899 Other long term (current) drug therapy: Secondary | ICD-10-CM | POA: Diagnosis not present

## 2019-04-20 DIAGNOSIS — Z91030 Bee allergy status: Secondary | ICD-10-CM | POA: Diagnosis not present

## 2019-04-20 DIAGNOSIS — Z881 Allergy status to other antibiotic agents status: Secondary | ICD-10-CM | POA: Diagnosis not present

## 2019-04-22 DIAGNOSIS — E7800 Pure hypercholesterolemia, unspecified: Secondary | ICD-10-CM | POA: Diagnosis not present

## 2019-04-22 DIAGNOSIS — E785 Hyperlipidemia, unspecified: Secondary | ICD-10-CM | POA: Diagnosis not present

## 2019-04-22 DIAGNOSIS — Z79899 Other long term (current) drug therapy: Secondary | ICD-10-CM | POA: Diagnosis not present

## 2019-04-22 DIAGNOSIS — E039 Hypothyroidism, unspecified: Secondary | ICD-10-CM | POA: Diagnosis not present

## 2019-04-22 DIAGNOSIS — C44799 Other specified malignant neoplasm of skin of left lower limb, including hip: Secondary | ICD-10-CM | POA: Diagnosis not present

## 2019-04-22 DIAGNOSIS — I10 Essential (primary) hypertension: Secondary | ICD-10-CM | POA: Diagnosis not present

## 2019-04-22 DIAGNOSIS — Z8673 Personal history of transient ischemic attack (TIA), and cerebral infarction without residual deficits: Secondary | ICD-10-CM | POA: Diagnosis not present

## 2019-04-22 DIAGNOSIS — C44729 Squamous cell carcinoma of skin of left lower limb, including hip: Secondary | ICD-10-CM | POA: Diagnosis not present

## 2019-04-22 DIAGNOSIS — Z7902 Long term (current) use of antithrombotics/antiplatelets: Secondary | ICD-10-CM | POA: Diagnosis not present

## 2019-05-06 ENCOUNTER — Ambulatory Visit: Payer: Medicare Other | Admitting: Cardiovascular Disease

## 2019-05-09 DIAGNOSIS — Z09 Encounter for follow-up examination after completed treatment for conditions other than malignant neoplasm: Secondary | ICD-10-CM | POA: Diagnosis not present

## 2019-05-09 DIAGNOSIS — C44729 Squamous cell carcinoma of skin of left lower limb, including hip: Secondary | ICD-10-CM | POA: Diagnosis not present

## 2019-05-30 DIAGNOSIS — C44729 Squamous cell carcinoma of skin of left lower limb, including hip: Secondary | ICD-10-CM | POA: Diagnosis not present

## 2019-06-01 DIAGNOSIS — Z23 Encounter for immunization: Secondary | ICD-10-CM | POA: Diagnosis not present

## 2019-06-06 DIAGNOSIS — X32XXXA Exposure to sunlight, initial encounter: Secondary | ICD-10-CM | POA: Diagnosis not present

## 2019-06-06 DIAGNOSIS — Z85828 Personal history of other malignant neoplasm of skin: Secondary | ICD-10-CM | POA: Diagnosis not present

## 2019-06-06 DIAGNOSIS — L570 Actinic keratosis: Secondary | ICD-10-CM | POA: Diagnosis not present

## 2019-06-06 DIAGNOSIS — Z08 Encounter for follow-up examination after completed treatment for malignant neoplasm: Secondary | ICD-10-CM | POA: Diagnosis not present

## 2019-06-29 DIAGNOSIS — Z23 Encounter for immunization: Secondary | ICD-10-CM | POA: Diagnosis not present

## 2019-06-30 DIAGNOSIS — L0100 Impetigo, unspecified: Secondary | ICD-10-CM | POA: Diagnosis not present

## 2019-06-30 DIAGNOSIS — L570 Actinic keratosis: Secondary | ICD-10-CM | POA: Diagnosis not present

## 2019-06-30 DIAGNOSIS — X32XXXD Exposure to sunlight, subsequent encounter: Secondary | ICD-10-CM | POA: Diagnosis not present

## 2019-07-07 DIAGNOSIS — I10 Essential (primary) hypertension: Secondary | ICD-10-CM | POA: Diagnosis not present

## 2019-07-07 DIAGNOSIS — R350 Frequency of micturition: Secondary | ICD-10-CM | POA: Diagnosis not present

## 2019-07-07 DIAGNOSIS — E039 Hypothyroidism, unspecified: Secondary | ICD-10-CM | POA: Diagnosis not present

## 2019-07-07 DIAGNOSIS — Z299 Encounter for prophylactic measures, unspecified: Secondary | ICD-10-CM | POA: Diagnosis not present

## 2019-07-07 DIAGNOSIS — F0391 Unspecified dementia with behavioral disturbance: Secondary | ICD-10-CM | POA: Diagnosis not present

## 2019-07-07 DIAGNOSIS — E119 Type 2 diabetes mellitus without complications: Secondary | ICD-10-CM | POA: Diagnosis not present

## 2019-07-07 DIAGNOSIS — Z6832 Body mass index (BMI) 32.0-32.9, adult: Secondary | ICD-10-CM | POA: Diagnosis not present

## 2019-10-14 DIAGNOSIS — E1165 Type 2 diabetes mellitus with hyperglycemia: Secondary | ICD-10-CM | POA: Diagnosis not present

## 2019-10-14 DIAGNOSIS — L98499 Non-pressure chronic ulcer of skin of other sites with unspecified severity: Secondary | ICD-10-CM | POA: Diagnosis not present

## 2019-10-14 DIAGNOSIS — I10 Essential (primary) hypertension: Secondary | ICD-10-CM | POA: Diagnosis not present

## 2019-10-14 DIAGNOSIS — Z6832 Body mass index (BMI) 32.0-32.9, adult: Secondary | ICD-10-CM | POA: Diagnosis not present

## 2019-10-14 DIAGNOSIS — F0391 Unspecified dementia with behavioral disturbance: Secondary | ICD-10-CM | POA: Diagnosis not present

## 2019-10-14 DIAGNOSIS — C50919 Malignant neoplasm of unspecified site of unspecified female breast: Secondary | ICD-10-CM | POA: Diagnosis not present

## 2019-10-14 DIAGNOSIS — Z299 Encounter for prophylactic measures, unspecified: Secondary | ICD-10-CM | POA: Diagnosis not present

## 2019-12-07 DIAGNOSIS — H524 Presbyopia: Secondary | ICD-10-CM | POA: Diagnosis not present

## 2019-12-07 DIAGNOSIS — Z961 Presence of intraocular lens: Secondary | ICD-10-CM | POA: Diagnosis not present

## 2019-12-07 DIAGNOSIS — H5010 Unspecified exotropia: Secondary | ICD-10-CM | POA: Diagnosis not present

## 2019-12-07 DIAGNOSIS — H52223 Regular astigmatism, bilateral: Secondary | ICD-10-CM | POA: Diagnosis not present

## 2019-12-07 DIAGNOSIS — H5203 Hypermetropia, bilateral: Secondary | ICD-10-CM | POA: Diagnosis not present

## 2019-12-07 DIAGNOSIS — H43813 Vitreous degeneration, bilateral: Secondary | ICD-10-CM | POA: Diagnosis not present

## 2019-12-29 DIAGNOSIS — I8311 Varicose veins of right lower extremity with inflammation: Secondary | ICD-10-CM | POA: Diagnosis not present

## 2019-12-29 DIAGNOSIS — I8312 Varicose veins of left lower extremity with inflammation: Secondary | ICD-10-CM | POA: Diagnosis not present

## 2019-12-29 DIAGNOSIS — M79605 Pain in left leg: Secondary | ICD-10-CM | POA: Diagnosis not present

## 2019-12-29 DIAGNOSIS — M79604 Pain in right leg: Secondary | ICD-10-CM | POA: Diagnosis not present

## 2020-01-30 DIAGNOSIS — M542 Cervicalgia: Secondary | ICD-10-CM | POA: Diagnosis not present

## 2020-02-07 DIAGNOSIS — E1165 Type 2 diabetes mellitus with hyperglycemia: Secondary | ICD-10-CM | POA: Diagnosis not present

## 2020-02-07 DIAGNOSIS — I10 Essential (primary) hypertension: Secondary | ICD-10-CM | POA: Diagnosis not present

## 2020-02-07 DIAGNOSIS — Z299 Encounter for prophylactic measures, unspecified: Secondary | ICD-10-CM | POA: Diagnosis not present

## 2020-02-07 DIAGNOSIS — F0391 Unspecified dementia with behavioral disturbance: Secondary | ICD-10-CM | POA: Diagnosis not present

## 2020-02-07 DIAGNOSIS — C50919 Malignant neoplasm of unspecified site of unspecified female breast: Secondary | ICD-10-CM | POA: Diagnosis not present

## 2020-02-07 DIAGNOSIS — Z2821 Immunization not carried out because of patient refusal: Secondary | ICD-10-CM | POA: Diagnosis not present

## 2020-02-09 DIAGNOSIS — I8311 Varicose veins of right lower extremity with inflammation: Secondary | ICD-10-CM | POA: Diagnosis not present

## 2020-02-09 DIAGNOSIS — R600 Localized edema: Secondary | ICD-10-CM | POA: Diagnosis not present

## 2020-02-09 DIAGNOSIS — I8312 Varicose veins of left lower extremity with inflammation: Secondary | ICD-10-CM | POA: Diagnosis not present

## 2020-03-08 DIAGNOSIS — Z1339 Encounter for screening examination for other mental health and behavioral disorders: Secondary | ICD-10-CM | POA: Diagnosis not present

## 2020-03-08 DIAGNOSIS — Z7189 Other specified counseling: Secondary | ICD-10-CM | POA: Diagnosis not present

## 2020-03-08 DIAGNOSIS — I10 Essential (primary) hypertension: Secondary | ICD-10-CM | POA: Diagnosis not present

## 2020-03-08 DIAGNOSIS — Z299 Encounter for prophylactic measures, unspecified: Secondary | ICD-10-CM | POA: Diagnosis not present

## 2020-03-08 DIAGNOSIS — Z79899 Other long term (current) drug therapy: Secondary | ICD-10-CM | POA: Diagnosis not present

## 2020-03-08 DIAGNOSIS — R5383 Other fatigue: Secondary | ICD-10-CM | POA: Diagnosis not present

## 2020-03-08 DIAGNOSIS — Z0000 Encounter for general adult medical examination without abnormal findings: Secondary | ICD-10-CM | POA: Diagnosis not present

## 2020-03-08 DIAGNOSIS — Z1331 Encounter for screening for depression: Secondary | ICD-10-CM | POA: Diagnosis not present

## 2020-03-08 DIAGNOSIS — E7800 Pure hypercholesterolemia, unspecified: Secondary | ICD-10-CM | POA: Diagnosis not present

## 2020-03-08 DIAGNOSIS — Z6831 Body mass index (BMI) 31.0-31.9, adult: Secondary | ICD-10-CM | POA: Diagnosis not present

## 2020-03-27 DIAGNOSIS — Z23 Encounter for immunization: Secondary | ICD-10-CM | POA: Diagnosis not present

## 2020-05-04 DIAGNOSIS — S52501A Unspecified fracture of the lower end of right radius, initial encounter for closed fracture: Secondary | ICD-10-CM | POA: Diagnosis not present

## 2020-05-04 DIAGNOSIS — Z9889 Other specified postprocedural states: Secondary | ICD-10-CM | POA: Diagnosis not present

## 2020-05-04 DIAGNOSIS — E1165 Type 2 diabetes mellitus with hyperglycemia: Secondary | ICD-10-CM | POA: Diagnosis not present

## 2020-05-04 DIAGNOSIS — M25431 Effusion, right wrist: Secondary | ICD-10-CM | POA: Diagnosis not present

## 2020-05-04 DIAGNOSIS — M19031 Primary osteoarthritis, right wrist: Secondary | ICD-10-CM | POA: Diagnosis not present

## 2020-05-04 DIAGNOSIS — M1811 Unilateral primary osteoarthritis of first carpometacarpal joint, right hand: Secondary | ICD-10-CM | POA: Diagnosis not present

## 2020-05-04 DIAGNOSIS — Z299 Encounter for prophylactic measures, unspecified: Secondary | ICD-10-CM | POA: Diagnosis not present

## 2020-05-04 DIAGNOSIS — F0390 Unspecified dementia without behavioral disturbance: Secondary | ICD-10-CM | POA: Diagnosis not present

## 2020-05-04 DIAGNOSIS — M19041 Primary osteoarthritis, right hand: Secondary | ICD-10-CM | POA: Diagnosis not present

## 2020-05-04 DIAGNOSIS — H05231 Hemorrhage of right orbit: Secondary | ICD-10-CM | POA: Diagnosis not present

## 2020-05-04 DIAGNOSIS — S52601A Unspecified fracture of lower end of right ulna, initial encounter for closed fracture: Secondary | ICD-10-CM | POA: Diagnosis not present

## 2020-05-04 DIAGNOSIS — M79631 Pain in right forearm: Secondary | ICD-10-CM | POA: Diagnosis not present

## 2020-05-04 DIAGNOSIS — M25531 Pain in right wrist: Secondary | ICD-10-CM | POA: Diagnosis not present

## 2020-05-09 ENCOUNTER — Encounter: Payer: Self-pay | Admitting: Orthopaedic Surgery

## 2020-05-09 ENCOUNTER — Ambulatory Visit (INDEPENDENT_AMBULATORY_CARE_PROVIDER_SITE_OTHER): Payer: Medicare Other | Admitting: Orthopaedic Surgery

## 2020-05-09 ENCOUNTER — Ambulatory Visit: Payer: Self-pay

## 2020-05-09 ENCOUNTER — Other Ambulatory Visit: Payer: Self-pay

## 2020-05-09 VITALS — BP 127/64 | HR 64 | Ht 62.0 in | Wt 174.0 lb

## 2020-05-09 DIAGNOSIS — S52501A Unspecified fracture of the lower end of right radius, initial encounter for closed fracture: Secondary | ICD-10-CM | POA: Diagnosis not present

## 2020-05-09 DIAGNOSIS — S62101A Fracture of unspecified carpal bone, right wrist, initial encounter for closed fracture: Secondary | ICD-10-CM | POA: Diagnosis not present

## 2020-05-09 DIAGNOSIS — W19XXXA Unspecified fall, initial encounter: Secondary | ICD-10-CM | POA: Diagnosis not present

## 2020-05-09 DIAGNOSIS — S52601A Unspecified fracture of lower end of right ulna, initial encounter for closed fracture: Secondary | ICD-10-CM | POA: Diagnosis not present

## 2020-05-09 NOTE — Progress Notes (Signed)
Subjective:    Patient ID: Crystal Rodriguez, female    DOB: November 30, 1932, 87 y.o.   MRN: [id number]  HPI She fell at home on 05-02-20.  She was seen and had X-rays done at [facility].  I have reviewed the notes and xray report.  I have independently reviewed and interpreted x-rays of this patient done at another site by another physician or qualified health professional.  She had surgery on the right distal radius with plate and screws by Dr. Durward Fortes in 2015 or 2016.    She does not have a splint.  She hit her right face and has a "black eye" on the right.  She says she did not lose consciousness and has no vision problems.  Her right wrist hurts.  She does not have a sling.   Review of Systems  Constitutional: Positive for activity change.  Musculoskeletal: Positive for arthralgias, joint swelling and myalgias.  All other systems reviewed and are negative.  For Review of Systems, all other systems reviewed and are negative.  The following is a summary of the past history medically, past history surgically, known current medicines, social history and family history.  This information is gathered electronically by the computer from prior information and documentation.  I review this each visit and have found including this information at this point in the chart is beneficial and informative.   Past Medical History:  Diagnosis Date  . Anemia   . Anxiety   . Cancer ([place])    left breast,  . Closed fracture of left distal radius   . Esophageal spasm    2011- Dr Delfin Edis  . GERD (gastroesophageal reflux disease)   . Hypertension    no meds  . Hypothyroidism   . Melena   . Stroke [facility])    no deficits  . Vision abnormalities     Past Surgical History:  Procedure Laterality Date  . BIOPSY  04/16/2018   Procedure: BIOPSY;  Surgeon: Rogene Houston, MD;  [facility];  Service: Endoscopy;;  gastric   . BREAST SURGERY  11/13/11   Lt parial mastectomy  .  ESOPHAGOGASTRODUODENOSCOPY (EGD) WITH PROPOFOL N/A 04/16/2018   Procedure: ESOPHAGOGASTRODUODENOSCOPY (EGD) WITH PROPOFOL;  Surgeon: Rogene Houston, MD;  [facility];  Service: Endoscopy;  Laterality: N/A;  . left knee surgery    . OPEN REDUCTION INTERNAL FIXATION (ORIF) DISTAL RADIAL FRACTURE Left 10/07/2018   Procedure: LEFT OPEN REDUCTION INTERNAL FIXATION (ORIF) DISTAL RADIAL FRACTURE;  Surgeon: Garald Balding, MD;  Location: [place];  Service: Orthopedics;  Laterality: Left;  . TEE WITHOUT CARDIOVERSION N/A 05/28/2015   Procedure: TRANSESOPHAGEAL ECHOCARDIOGRAM (TEE);  Surgeon: Skeet Latch, MD;  Location: [facility] END[facility];  Service: Cardiovascular;  Laterality: N/A;    Current Outpatient Medications on File Prior to Visit  Medication Sig Dispense Refill  . aspirin 81 MG tablet Take 1 tablet (81 mg total) by mouth daily. 30 tablet   . atorvastatin (LIPITOR) 10 MG tablet Take 5 mg by mouth daily.   12  . Calcium Carbonate-Vit D-Min (CALTRATE 600+D PLUS MINERALS PO) Take 1 tablet by mouth daily.     . clopidogrel (PLAVIX) 75 MG tablet Take 75 mg by mouth daily.    Marland Kitchen donepezil (ARICEPT) 5 MG tablet Take 5 mg by mouth daily.    Marland Kitchen EPINEPHrine 0.3 mg/0.3 mL IJ SOAJ injection Inject 0.3 mg into the muscle as needed (for bee stings).     Marland Kitchen  HYDROcodone-acetaminophen (NORCO) 5-325 MG tablet Take 1 tablet by mouth every 6 (six) hours as needed for moderate pain. 30 tablet 0  . ketoconazole (NIZORAL) 2 % cream APPLY TO THE AFFECTED AREA(S) ON ABDOMEN AND BETWEEN LEGS FOLD TWICE DAILY    . levothyroxine (SYNTHROID, LEVOTHROID) 112 MCG tablet Take 112 mcg by mouth daily before breakfast.     . mupirocin ointment (BACTROBAN) 2 % APPLY TO BIOPSY SITES ON CHEST TWICE DAILY    . pantoprazole (PROTONIX) 40 MG tablet Take 40 mg by mouth daily.    . sertraline (ZOLOFT) 100 MG tablet Take 100 mg by mouth daily.      No current facility-administered medications on file prior to  visit.    Social History   Socioeconomic History  . Marital status: Widowed    Spouse name: Not on file  . Number of children: Not on file  . Years of education: Not on file  . Highest education level: Not on file  Occupational History  . Not on file  Tobacco Use  . Smoking status: Never Smoker  . Smokeless tobacco: Never Used  Vaping Use  . Vaping Use: Never used  Substance and Sexual Activity  . Alcohol use: No  . Drug use: No  . Sexual activity: Not Currently    Birth control/protection: None  Other Topics Concern  . Not on file  Social History Narrative  . Not on file   Social Determinants of Health   Financial Resource Strain: Not on file  Food Insecurity: Not on file  Transportation Needs: Not on file  Physical Activity: Not on file  Stress: Not on file  Social Connections: Not on file  Intimate Partner Violence: Not on file    Family History  Problem Relation Age of Onset  . Heart disease Mother   . Diabetes Mother   . Cancer Mother        uterian    BP 127/64   Pulse 64   Ht 5\' 2"  (1.575 m)   Wt 174 lb (78.9 kg)   BMI 31.83 kg/m   Body mass index is 31.83 kg/m.      Objective:   Physical Exam Vitals and nursing note reviewed. Exam conducted with a chaperone present.  Constitutional:      Appearance: She is well-developed and well-nourished.  HENT:     Head: Normocephalic and atraumatic.  Eyes:     Extraocular Movements: EOM normal.     Conjunctiva/sclera: Conjunctivae normal.     Pupils: Pupils are equal, round, and reactive to light.  Cardiovascular:     Rate and Rhythm: Normal rate and regular rhythm.     Pulses: Intact distal pulses.  Pulmonary:     Effort: Pulmonary effort is normal.  Abdominal:     Palpations: Abdomen is soft.  Musculoskeletal:       Hands:     Cervical back: Normal range of motion and neck supple.  Skin:    General: Skin is warm and dry.  Neurological:     Mental Status: She is alert and oriented to  person, place, and time.     Cranial Nerves: No cranial nerve deficit.     Motor: No abnormal muscle tone.     Coordination: Coordination normal.     Deep Tendon Reflexes: Reflexes are normal and symmetric. Reflexes normal.  Psychiatric:        Mood and Affect: Mood and affect normal.        Behavior: Behavior  normal.        Thought Content: Thought content normal.        Judgment: Judgment normal.     X-rays were done of the right wrist, reported separately.      Assessment & Plan:   Encounter Diagnoses  Name Primary?  . Closed fracture of right wrist, initial encounter Yes  . Closed fracture of distal ends of right radius and ulna, initial encounter    She has fracture just to the ulnar side of the distal radius  The articular surface is maintained but she has slight displacement of radius more proximal.  This is a week old and has not moved.  I think it will heal without need for further surgery of the wrist.  I have told her this.  I placed her in a sugar tong splint and sling.  She says she does not need pain medicine.  I will see her in two weeks.  X-rays then in the cast.  Call if any problem.  Precautions discussed.   Electronically Signed Sanjuana Kava, MD 2/16/202211:45 AM

## 2020-05-10 ENCOUNTER — Telehealth: Payer: Self-pay | Admitting: Radiology

## 2020-05-10 NOTE — Telephone Encounter (Signed)
FYI   Patient came in to [facility] office today for splint check. She states that splint was applied yesterday and has been cutting into her skin and very tight. She has removed and reapplied splint. Skin checked and new cotton applied. Same splint is used and rewrapped in ace. Edges trimmed so no sharp places felt. No broken skin seen, but some irritation top of hand. Expressed to patient importance of maintaining fracture and that she is not to remove splint. Advised splint to stay on until follow up in the office. Also expressed importance of elevation for swelling. Patient states that she understands. Advised to call if she has problems.

## 2020-05-23 ENCOUNTER — Other Ambulatory Visit: Payer: Self-pay

## 2020-05-23 ENCOUNTER — Ambulatory Visit: Payer: Medicare Other | Admitting: Orthopaedic Surgery

## 2020-05-30 ENCOUNTER — Encounter: Payer: Self-pay | Admitting: Orthopaedic Surgery

## 2020-05-30 ENCOUNTER — Ambulatory Visit (INDEPENDENT_AMBULATORY_CARE_PROVIDER_SITE_OTHER): Payer: Medicare Other | Admitting: Orthopaedic Surgery

## 2020-05-30 ENCOUNTER — Ambulatory Visit (INDEPENDENT_AMBULATORY_CARE_PROVIDER_SITE_OTHER): Payer: Medicare Other

## 2020-05-30 ENCOUNTER — Other Ambulatory Visit: Payer: Self-pay

## 2020-05-30 VITALS — Ht 62.0 in | Wt 174.0 lb

## 2020-05-30 DIAGNOSIS — M25531 Pain in right wrist: Secondary | ICD-10-CM | POA: Diagnosis not present

## 2020-05-30 NOTE — Progress Notes (Signed)
Office Visit Note   Patient: Crystal Rodriguez           Date of Birth: 1932-04-01           MRN: [id number] Visit Date: 05/30/2020              Requested by: Glenda Chroman, MD [place],  [place] [address] PCP: Glenda Chroman, MD   Assessment & Plan: Visit Diagnoses:  1. Pain in right wrist     Plan: Mrs. Eischeid fell and injured her right wrist approximately 4 weeks ago.  She was seen by Dr. Luna Glasgow last week who thought she may have a nondisplaced fracture beneath the previous plate on the distal radius.  Prior injury was about 7 years ago.  Has had difficulty wearing the sugar tong splint.  Today x-rays demonstrated little if any displacement of the fracture also considerable degenerative change within the carpus and at the base of the thumb with widening of the navicular lunate joint.  This could be acute contributing to her pain..  She is actually doing quite well.  I am going to place her in a volar wrist splint and just have her return in 2 weeks  Follow-Up Instructions: Return in about 2 weeks (around 06/13/2020).   Orders:  Orders Placed This Encounter  Procedures  . XR Wrist Complete Right   No orders of the defined types were placed in this encounter.     Procedures: No procedures performed   Clinical Data: No additional findings.   Subjective: Chief Complaint  Patient presents with  . Right Wrist - Follow-up    Right wrist fracture  Patient presents today for right wrist pain. She is now 4 weeks out from injury. She saw Dr.Keeling three weeks ago and was placed into a sugar tong splint. She was very unhappy with her experience with him and wishes to not see him again. She states that her wrist is doing better. She does taking pain medicine if needed. She is right hand dominant.   HPI  Review of Systems   Objective: Vital Signs: Ht 5\' 2"  (1.575 m)   Wt 174 lb (78.9 kg)   BMI 31.83 kg/m   Physical Exam Constitutional:      Appearance: She is  well-developed and well-nourished.  HENT:     Mouth/Throat:     Mouth: Oropharynx is clear and moist.  Eyes:     Extraocular Movements: EOM normal.     Pupils: Pupils are equal, round, and reactive to light.  Pulmonary:     Effort: Pulmonary effort is normal.  Skin:    General: Skin is warm and dry.  Neurological:     Mental Status: She is alert and oriented to person, place, and time.  Psychiatric:        Mood and Affect: Mood and affect normal.        Behavior: Behavior normal.     Ortho Exam awake alert and oriented x3.  Comfortable in no acute distress.  Splint was removed from her right wrist.  There is no ecchymosis.  Neurologically intact.  She has more pain in the dorsum of her hand and she does over the distal radius or ulna.  Skin intact.  Some pain at the base of her thumb.  Full pronation and supination passively.  Specialty Comments:  No specialty comments available.  Imaging: XR Wrist Complete Right  Result Date: 05/30/2020 Films of the right wrist were obtained in  several projections.  There is been a prior open reduction internal fixation with a hand innovation plate at the distal radius.  There appears to be an old ulnar styloid fracture.  The plate and screws appear to be intact.  There may have been a nondisplaced fracture beneath the plate but there is significant degenerative change at the base of the thumb and in the mid carpus particularly radially.  In addition there is widening of the navicular lunate articulation.  I do not see this on films from 2018    PMFS History: Patient Active Problem List   Diagnosis Date Noted  . Pain in right wrist 05/30/2020  . Fracture of left distal radius 10/14/2018  . Wrist fracture, closed, left, initial encounter 10/04/2018  . Melena 04/08/2018  . Absolute anemia 06/10/2017  . Chronic ischemic right MCA stroke   . Embolic stroke ([place]) 66/59/9357  . Breast cancer, left breast ([place]) 10/29/2011  . HYPOTHYROIDISM  01/15/2009  . DIABETES MELLITUS, TYPE II 01/15/2009  . HYPERCHOLESTEROLEMIA 01/15/2009  . DEPRESSION 01/15/2009  . DIVERTICULOSIS, COLON 01/15/2009  . DYSPHAGIA UNSPECIFIED 01/15/2009   Past Medical History:  Diagnosis Date  . Anemia   . Anxiety   . Cancer ([place])    left breast,  . Closed fracture of left distal radius   . Esophageal spasm    2011- Dr Delfin Edis  . GERD (gastroesophageal reflux disease)   . Hypertension    no meds  . Hypothyroidism   . Melena   . Stroke [facility])    no deficits  . Vision abnormalities     Family History  Problem Relation Age of Onset  . Heart disease Mother   . Diabetes Mother   . Cancer Mother        Theadora Rama    Past Surgical History:  Procedure Laterality Date  . BIOPSY  04/16/2018   Procedure: BIOPSY;  Surgeon: Rogene Houston, MD;  [facility];  Service: Endoscopy;;  gastric   . BREAST SURGERY  11/13/11   Lt parial mastectomy  . ESOPHAGOGASTRODUODENOSCOPY (EGD) WITH PROPOFOL N/A 04/16/2018   Procedure: ESOPHAGOGASTRODUODENOSCOPY (EGD) WITH PROPOFOL;  Surgeon: Rogene Houston, MD;  [facility];  Service: Endoscopy;  Laterality: N/A;  . left knee surgery    . OPEN REDUCTION INTERNAL FIXATION (ORIF) DISTAL RADIAL FRACTURE Left 10/07/2018   Procedure: LEFT OPEN REDUCTION INTERNAL FIXATION (ORIF) DISTAL RADIAL FRACTURE;  Surgeon: Garald Balding, MD;  Location: [place];  Service: Orthopedics;  Laterality: Left;  . TEE WITHOUT CARDIOVERSION N/A 05/28/2015   Procedure: TRANSESOPHAGEAL ECHOCARDIOGRAM (TEE);  Surgeon: Skeet Latch, MD;  Location: [place];  Service: Cardiovascular;  Laterality: N/A;   Social History   Occupational History  . Not on file  Tobacco Use  . Smoking status: Never Smoker  . Smokeless tobacco: Never Used  Vaping Use  . Vaping Use: Never used  Substance and Sexual Activity  . Alcohol use: No  . Drug use: No  . Sexual activity: Not Currently    Birth  control/protection: None

## 2020-06-13 ENCOUNTER — Ambulatory Visit (INDEPENDENT_AMBULATORY_CARE_PROVIDER_SITE_OTHER): Payer: Medicare Other

## 2020-06-13 ENCOUNTER — Encounter: Payer: Self-pay | Admitting: Orthopaedic Surgery

## 2020-06-13 ENCOUNTER — Other Ambulatory Visit: Payer: Self-pay

## 2020-06-13 ENCOUNTER — Ambulatory Visit (INDEPENDENT_AMBULATORY_CARE_PROVIDER_SITE_OTHER): Payer: Medicare Other | Admitting: Orthopaedic Surgery

## 2020-06-13 VITALS — Ht 62.0 in | Wt 174.0 lb

## 2020-06-13 DIAGNOSIS — M25531 Pain in right wrist: Secondary | ICD-10-CM | POA: Diagnosis not present

## 2020-06-13 NOTE — Progress Notes (Signed)
Office Visit Note   Patient: Crystal Rodriguez           Date of Birth: [birth year]           MRN: [id number] Visit Date: 06/13/2020              Requested by: Glenda Chroman, MD [place],  [place] [id number] PCP: Glenda Chroman, MD   Assessment & Plan: Visit Diagnoses:  1. Pain in right wrist     Plan: Crystal Rodriguez notes that she is doing quite well at the present time and not really having much pain.  Previous films demonstrated significant arthritis within the carpus of her right hand hand, particularly, at the base of the thumb.  This may have been contributing to some of her pain.  In addition there was evidence of a nondisplaced fracture beneath the previously inserted distal radius fracture.  This also appears to be in good position without change on films today.  She is got good grip and good release and no localized areas of tenderness.  I think she can just discontinue the splint and return to see me as needed.  Follow-Up Instructions: Return if symptoms worsen or fail to improve.   Orders:  Orders Placed This Encounter  Procedures  . XR Wrist Complete Right   No orders of the defined types were placed in this encounter.     Procedures: No procedures performed   Clinical Data: No additional findings.   Subjective: Chief Complaint  Patient presents with  . Right Wrist - Follow-up  Patient presents today for a two week follow up on her right wrist. She states that she still has soreness in her right wrist along with some minor swelling. She is wearing a removable wrist brace.   HPI  Review of Systems   Objective: Vital Signs: Ht 5\' 2"  (1.575 m)   Wt 174 lb (78.9 kg)   BMI 31.83 kg/m   Physical Exam Constitutional:      Appearance: She is well-developed.  Eyes:     Pupils: Pupils are equal, round, and reactive to light.  Pulmonary:     Effort: Pulmonary effort is normal.  Skin:    General: Skin is warm and dry.  Neurological:     Mental  Status: She is alert and oriented to person, place, and time.  Psychiatric:        Behavior: Behavior normal.     Ortho Exam right wrist was not swollen.  No ecchymosis or erythema.  No localized areas of tenderness at the distal ulna or the radius.  She had some discomfort in the carpus and at the base of the thumb with a positive grind test.  Some limitation of dorsiflexion and volar flexion of the wrist but seems to have almost normal pronation and supination.  Specialty Comments:  No specialty comments available.  Imaging: XR Wrist Complete Right  Result Date: 06/13/2020 Films of the right wrist were obtained in several projections.  These were compared to films from her previous visit 2 weeks ago.  There does not appear to be any significant change.  There is considerable degenerative arthritis within the carpus with sclerosis and narrowing of multiple joints and, particularly, at the base of the thumb.  There also is an old fracture of the ulnar styloid.  As previously discussed it may have been a nondisplaced fracture of the distal radius beneath the previously inserted plate of 6 years ago.  No  change in position.  Clinically the patient is not having any pain    PMFS History: Patient Active Problem List   Diagnosis Date Noted  . Pain in right wrist 05/30/2020  . Fracture of left distal radius 10/14/2018  . Wrist fracture, closed, left, initial encounter 10/04/2018  . Melena 04/08/2018  . Absolute anemia 06/10/2017  . Chronic ischemic right MCA stroke   . Embolic stroke (Crystal Rodriguez) 59/16/3846  . Breast cancer, left breast ([facility] [place]) 10/29/2011  . HYPOTHYROIDISM 01/15/2009  . DIABETES MELLITUS, TYPE II 01/15/2009  . HYPERCHOLESTEROLEMIA 01/15/2009  . DEPRESSION 01/15/2009  . DIVERTICULOSIS, COLON 01/15/2009  . DYSPHAGIA UNSPECIFIED 01/15/2009   Past Medical History:  Diagnosis Date  . Anemia   . Anxiety   . Cancer ([facility])    left breast,  . Closed fracture of left distal radius    . Esophageal spasm    2011- Dr Delfin Edis  . GERD (gastroesophageal reflux disease)   . Hypertension    no meds  . Hypothyroidism   . Melena   . Stroke [facility])    no deficits  . Vision abnormalities     Family History  Problem Relation Age of Onset  . Heart disease Mother   . Diabetes Mother   . Cancer Mother        Theadora Rama    Past Surgical History:  Procedure Laterality Date  . BIOPSY  04/16/2018   Procedure: BIOPSY;  Surgeon: Rogene Houston, MD;  [facility];  Service: Endoscopy;;  gastric   . BREAST SURGERY  11/13/11   Lt parial mastectomy  . ESOPHAGOGASTRODUODENOSCOPY (EGD) WITH PROPOFOL N/A 04/16/2018   Procedure: ESOPHAGOGASTRODUODENOSCOPY (EGD) WITH PROPOFOL;  Surgeon: Rogene Houston, MD;  [facility]: [facility];  Service: Endoscopy;  Laterality: N/A;  . left knee surgery    . OPEN REDUCTION INTERNAL FIXATION (ORIF) DISTAL RADIAL FRACTURE Left 10/07/2018   Procedure: LEFT OPEN REDUCTION INTERNAL FIXATION (ORIF) DISTAL RADIAL FRACTURE;  Surgeon: Garald Balding, MD;  Location: [facility];  Service: Orthopedics;  Laterality: Left;  . TEE WITHOUT CARDIOVERSION N/A 05/28/2015   Procedure: TRANSESOPHAGEAL ECHOCARDIOGRAM (TEE);  Surgeon: Skeet Latch, MD;  Location: [facility];  Service: Cardiovascular;  Laterality: N/A;   Social History   Occupational History  . Not on file  Tobacco Use  . Smoking status: Never Smoker  . Smokeless tobacco: Never Used  Vaping Use  . Vaping Use: Never used  Substance and Sexual Activity  . Alcohol use: No  . Drug use: No  . Sexual activity: Not Currently    Birth control/protection: None

## 2020-08-02 DIAGNOSIS — I890 Lymphedema, not elsewhere classified: Secondary | ICD-10-CM | POA: Diagnosis not present

## 2020-08-02 DIAGNOSIS — L97221 Non-pressure chronic ulcer of left calf limited to breakdown of skin: Secondary | ICD-10-CM | POA: Diagnosis not present

## 2020-08-08 DIAGNOSIS — I890 Lymphedema, not elsewhere classified: Secondary | ICD-10-CM | POA: Diagnosis not present

## 2020-08-28 DIAGNOSIS — Z23 Encounter for immunization: Secondary | ICD-10-CM | POA: Diagnosis not present

## 2020-10-15 DIAGNOSIS — Z299 Encounter for prophylactic measures, unspecified: Secondary | ICD-10-CM | POA: Diagnosis not present

## 2020-10-15 DIAGNOSIS — F0391 Unspecified dementia with behavioral disturbance: Secondary | ICD-10-CM | POA: Diagnosis not present

## 2020-10-15 DIAGNOSIS — Z6831 Body mass index (BMI) 31.0-31.9, adult: Secondary | ICD-10-CM | POA: Diagnosis not present

## 2020-10-15 DIAGNOSIS — E1165 Type 2 diabetes mellitus with hyperglycemia: Secondary | ICD-10-CM | POA: Diagnosis not present

## 2020-10-15 DIAGNOSIS — I10 Essential (primary) hypertension: Secondary | ICD-10-CM | POA: Diagnosis not present

## 2020-11-12 DIAGNOSIS — L97221 Non-pressure chronic ulcer of left calf limited to breakdown of skin: Secondary | ICD-10-CM | POA: Diagnosis not present

## 2020-11-12 DIAGNOSIS — I890 Lymphedema, not elsewhere classified: Secondary | ICD-10-CM | POA: Diagnosis not present

## 2020-11-12 DIAGNOSIS — I83893 Varicose veins of bilateral lower extremities with other complications: Secondary | ICD-10-CM | POA: Diagnosis not present

## 2020-11-19 DIAGNOSIS — I890 Lymphedema, not elsewhere classified: Secondary | ICD-10-CM | POA: Diagnosis not present

## 2020-11-27 DIAGNOSIS — I8311 Varicose veins of right lower extremity with inflammation: Secondary | ICD-10-CM | POA: Diagnosis not present

## 2020-11-27 DIAGNOSIS — I83893 Varicose veins of bilateral lower extremities with other complications: Secondary | ICD-10-CM | POA: Diagnosis not present

## 2020-11-27 DIAGNOSIS — L97221 Non-pressure chronic ulcer of left calf limited to breakdown of skin: Secondary | ICD-10-CM | POA: Diagnosis not present

## 2020-11-27 DIAGNOSIS — I890 Lymphedema, not elsewhere classified: Secondary | ICD-10-CM | POA: Diagnosis not present

## 2020-11-27 DIAGNOSIS — L97211 Non-pressure chronic ulcer of right calf limited to breakdown of skin: Secondary | ICD-10-CM | POA: Diagnosis not present

## 2020-11-27 DIAGNOSIS — I8312 Varicose veins of left lower extremity with inflammation: Secondary | ICD-10-CM | POA: Diagnosis not present

## 2020-12-10 DIAGNOSIS — Z20828 Contact with and (suspected) exposure to other viral communicable diseases: Secondary | ICD-10-CM | POA: Diagnosis not present

## 2020-12-11 DIAGNOSIS — Z23 Encounter for immunization: Secondary | ICD-10-CM | POA: Diagnosis not present

## 2020-12-28 DIAGNOSIS — F419 Anxiety disorder, unspecified: Secondary | ICD-10-CM | POA: Diagnosis not present

## 2020-12-28 DIAGNOSIS — Z299 Encounter for prophylactic measures, unspecified: Secondary | ICD-10-CM | POA: Diagnosis not present

## 2020-12-28 DIAGNOSIS — I10 Essential (primary) hypertension: Secondary | ICD-10-CM | POA: Diagnosis not present

## 2020-12-28 DIAGNOSIS — F0390 Unspecified dementia without behavioral disturbance: Secondary | ICD-10-CM | POA: Diagnosis not present

## 2020-12-28 DIAGNOSIS — B029 Zoster without complications: Secondary | ICD-10-CM | POA: Diagnosis not present

## 2021-01-23 DIAGNOSIS — I10 Essential (primary) hypertension: Secondary | ICD-10-CM | POA: Diagnosis not present

## 2021-01-23 DIAGNOSIS — I890 Lymphedema, not elsewhere classified: Secondary | ICD-10-CM | POA: Diagnosis not present

## 2021-01-23 DIAGNOSIS — D509 Iron deficiency anemia, unspecified: Secondary | ICD-10-CM | POA: Diagnosis not present

## 2021-01-23 DIAGNOSIS — Z299 Encounter for prophylactic measures, unspecified: Secondary | ICD-10-CM | POA: Diagnosis not present

## 2021-01-23 DIAGNOSIS — E1165 Type 2 diabetes mellitus with hyperglycemia: Secondary | ICD-10-CM | POA: Diagnosis not present

## 2021-02-28 DIAGNOSIS — Z20828 Contact with and (suspected) exposure to other viral communicable diseases: Secondary | ICD-10-CM | POA: Diagnosis not present

## 2021-03-01 DIAGNOSIS — E039 Hypothyroidism, unspecified: Secondary | ICD-10-CM | POA: Diagnosis not present

## 2021-03-01 DIAGNOSIS — I10 Essential (primary) hypertension: Secondary | ICD-10-CM | POA: Diagnosis not present

## 2021-03-01 DIAGNOSIS — Z7189 Other specified counseling: Secondary | ICD-10-CM | POA: Diagnosis not present

## 2021-03-01 DIAGNOSIS — Z1339 Encounter for screening examination for other mental health and behavioral disorders: Secondary | ICD-10-CM | POA: Diagnosis not present

## 2021-03-01 DIAGNOSIS — E7800 Pure hypercholesterolemia, unspecified: Secondary | ICD-10-CM | POA: Diagnosis not present

## 2021-03-01 DIAGNOSIS — Z79899 Other long term (current) drug therapy: Secondary | ICD-10-CM | POA: Diagnosis not present

## 2021-03-01 DIAGNOSIS — Z299 Encounter for prophylactic measures, unspecified: Secondary | ICD-10-CM | POA: Diagnosis not present

## 2021-03-01 DIAGNOSIS — R5383 Other fatigue: Secondary | ICD-10-CM | POA: Diagnosis not present

## 2021-03-01 DIAGNOSIS — Z0000 Encounter for general adult medical examination without abnormal findings: Secondary | ICD-10-CM | POA: Diagnosis not present

## 2021-03-01 DIAGNOSIS — Z6830 Body mass index (BMI) 30.0-30.9, adult: Secondary | ICD-10-CM | POA: Diagnosis not present

## 2021-03-01 DIAGNOSIS — Z1331 Encounter for screening for depression: Secondary | ICD-10-CM | POA: Diagnosis not present

## 2021-05-01 DIAGNOSIS — F3342 Major depressive disorder, recurrent, in full remission: Secondary | ICD-10-CM | POA: Diagnosis not present

## 2021-05-01 DIAGNOSIS — I10 Essential (primary) hypertension: Secondary | ICD-10-CM | POA: Diagnosis not present

## 2021-05-01 DIAGNOSIS — Z6830 Body mass index (BMI) 30.0-30.9, adult: Secondary | ICD-10-CM | POA: Diagnosis not present

## 2021-05-01 DIAGNOSIS — Z789 Other specified health status: Secondary | ICD-10-CM | POA: Diagnosis not present

## 2021-05-01 DIAGNOSIS — I739 Peripheral vascular disease, unspecified: Secondary | ICD-10-CM | POA: Diagnosis not present

## 2021-05-01 DIAGNOSIS — F0390 Unspecified dementia without behavioral disturbance: Secondary | ICD-10-CM | POA: Diagnosis not present

## 2021-05-01 DIAGNOSIS — E1165 Type 2 diabetes mellitus with hyperglycemia: Secondary | ICD-10-CM | POA: Diagnosis not present

## 2021-05-01 DIAGNOSIS — Z299 Encounter for prophylactic measures, unspecified: Secondary | ICD-10-CM | POA: Diagnosis not present

## 2021-07-18 DIAGNOSIS — L0390 Cellulitis, unspecified: Secondary | ICD-10-CM | POA: Diagnosis not present

## 2021-07-18 DIAGNOSIS — I890 Lymphedema, not elsewhere classified: Secondary | ICD-10-CM | POA: Diagnosis not present

## 2021-07-18 DIAGNOSIS — Z299 Encounter for prophylactic measures, unspecified: Secondary | ICD-10-CM | POA: Diagnosis not present

## 2021-07-18 DIAGNOSIS — I10 Essential (primary) hypertension: Secondary | ICD-10-CM | POA: Diagnosis not present

## 2021-07-18 DIAGNOSIS — Z6830 Body mass index (BMI) 30.0-30.9, adult: Secondary | ICD-10-CM | POA: Diagnosis not present

## 2021-07-29 DIAGNOSIS — Z20828 Contact with and (suspected) exposure to other viral communicable diseases: Secondary | ICD-10-CM | POA: Diagnosis not present

## 2021-07-30 DIAGNOSIS — Z23 Encounter for immunization: Secondary | ICD-10-CM | POA: Diagnosis not present

## 2021-08-09 DIAGNOSIS — Z789 Other specified health status: Secondary | ICD-10-CM | POA: Diagnosis not present

## 2021-08-09 DIAGNOSIS — E1165 Type 2 diabetes mellitus with hyperglycemia: Secondary | ICD-10-CM | POA: Diagnosis not present

## 2021-08-09 DIAGNOSIS — Z299 Encounter for prophylactic measures, unspecified: Secondary | ICD-10-CM | POA: Diagnosis not present

## 2021-08-09 DIAGNOSIS — I10 Essential (primary) hypertension: Secondary | ICD-10-CM | POA: Diagnosis not present

## 2021-11-01 DIAGNOSIS — L97919 Non-pressure chronic ulcer of unspecified part of right lower leg with unspecified severity: Secondary | ICD-10-CM | POA: Diagnosis not present

## 2021-11-01 DIAGNOSIS — Z6827 Body mass index (BMI) 27.0-27.9, adult: Secondary | ICD-10-CM | POA: Diagnosis not present

## 2021-11-01 DIAGNOSIS — I10 Essential (primary) hypertension: Secondary | ICD-10-CM | POA: Diagnosis not present

## 2021-11-01 DIAGNOSIS — Z713 Dietary counseling and surveillance: Secondary | ICD-10-CM | POA: Diagnosis not present

## 2021-11-01 DIAGNOSIS — F0390 Unspecified dementia without behavioral disturbance: Secondary | ICD-10-CM | POA: Diagnosis not present

## 2021-11-01 DIAGNOSIS — Z299 Encounter for prophylactic measures, unspecified: Secondary | ICD-10-CM | POA: Diagnosis not present

## 2021-11-13 DIAGNOSIS — I10 Essential (primary) hypertension: Secondary | ICD-10-CM | POA: Diagnosis not present

## 2021-11-13 DIAGNOSIS — K5900 Constipation, unspecified: Secondary | ICD-10-CM | POA: Diagnosis not present

## 2021-11-13 DIAGNOSIS — E1165 Type 2 diabetes mellitus with hyperglycemia: Secondary | ICD-10-CM | POA: Diagnosis not present

## 2021-11-13 DIAGNOSIS — Z299 Encounter for prophylactic measures, unspecified: Secondary | ICD-10-CM | POA: Diagnosis not present

## 2021-11-29 DIAGNOSIS — I10 Essential (primary) hypertension: Secondary | ICD-10-CM | POA: Diagnosis not present

## 2021-11-29 DIAGNOSIS — Z299 Encounter for prophylactic measures, unspecified: Secondary | ICD-10-CM | POA: Diagnosis not present

## 2021-11-29 DIAGNOSIS — Z789 Other specified health status: Secondary | ICD-10-CM | POA: Diagnosis not present

## 2021-11-29 DIAGNOSIS — D692 Other nonthrombocytopenic purpura: Secondary | ICD-10-CM | POA: Diagnosis not present

## 2021-11-29 DIAGNOSIS — R21 Rash and other nonspecific skin eruption: Secondary | ICD-10-CM | POA: Diagnosis not present

## 2022-01-21 DIAGNOSIS — E1165 Type 2 diabetes mellitus with hyperglycemia: Secondary | ICD-10-CM | POA: Diagnosis not present

## 2022-01-21 DIAGNOSIS — I10 Essential (primary) hypertension: Secondary | ICD-10-CM | POA: Diagnosis not present

## 2022-01-23 DIAGNOSIS — Z299 Encounter for prophylactic measures, unspecified: Secondary | ICD-10-CM | POA: Diagnosis not present

## 2022-01-23 DIAGNOSIS — F339 Major depressive disorder, recurrent, unspecified: Secondary | ICD-10-CM | POA: Diagnosis not present

## 2022-01-23 DIAGNOSIS — F419 Anxiety disorder, unspecified: Secondary | ICD-10-CM | POA: Diagnosis not present

## 2022-01-23 DIAGNOSIS — I10 Essential (primary) hypertension: Secondary | ICD-10-CM | POA: Diagnosis not present

## 2022-01-28 DIAGNOSIS — H35373 Puckering of macula, bilateral: Secondary | ICD-10-CM | POA: Diagnosis not present

## 2022-02-20 DIAGNOSIS — Z299 Encounter for prophylactic measures, unspecified: Secondary | ICD-10-CM | POA: Diagnosis not present

## 2022-02-20 DIAGNOSIS — E1165 Type 2 diabetes mellitus with hyperglycemia: Secondary | ICD-10-CM | POA: Diagnosis not present

## 2022-02-20 DIAGNOSIS — I10 Essential (primary) hypertension: Secondary | ICD-10-CM | POA: Diagnosis not present

## 2022-03-13 DIAGNOSIS — E7800 Pure hypercholesterolemia, unspecified: Secondary | ICD-10-CM | POA: Diagnosis not present

## 2022-03-13 DIAGNOSIS — Z1339 Encounter for screening examination for other mental health and behavioral disorders: Secondary | ICD-10-CM | POA: Diagnosis not present

## 2022-03-13 DIAGNOSIS — Z79899 Other long term (current) drug therapy: Secondary | ICD-10-CM | POA: Diagnosis not present

## 2022-03-13 DIAGNOSIS — Z0000 Encounter for general adult medical examination without abnormal findings: Secondary | ICD-10-CM | POA: Diagnosis not present

## 2022-03-13 DIAGNOSIS — E039 Hypothyroidism, unspecified: Secondary | ICD-10-CM | POA: Diagnosis not present

## 2022-03-13 DIAGNOSIS — Z299 Encounter for prophylactic measures, unspecified: Secondary | ICD-10-CM | POA: Diagnosis not present

## 2022-03-13 DIAGNOSIS — I10 Essential (primary) hypertension: Secondary | ICD-10-CM | POA: Diagnosis not present

## 2022-03-13 DIAGNOSIS — Z1331 Encounter for screening for depression: Secondary | ICD-10-CM | POA: Diagnosis not present

## 2022-03-13 DIAGNOSIS — R5383 Other fatigue: Secondary | ICD-10-CM | POA: Diagnosis not present

## 2022-03-13 DIAGNOSIS — Z6825 Body mass index (BMI) 25.0-25.9, adult: Secondary | ICD-10-CM | POA: Diagnosis not present

## 2022-03-13 DIAGNOSIS — Z7189 Other specified counseling: Secondary | ICD-10-CM | POA: Diagnosis not present

## 2022-03-13 DIAGNOSIS — Z789 Other specified health status: Secondary | ICD-10-CM | POA: Diagnosis not present

## 2022-03-22 DIAGNOSIS — Z23 Encounter for immunization: Secondary | ICD-10-CM | POA: Diagnosis not present

## 2022-03-26 DIAGNOSIS — H35363 Drusen (degenerative) of macula, bilateral: Secondary | ICD-10-CM | POA: Diagnosis not present

## 2022-04-14 DIAGNOSIS — I83893 Varicose veins of bilateral lower extremities with other complications: Secondary | ICD-10-CM | POA: Diagnosis not present

## 2022-05-19 DIAGNOSIS — I10 Essential (primary) hypertension: Secondary | ICD-10-CM | POA: Diagnosis not present

## 2022-05-19 DIAGNOSIS — Z299 Encounter for prophylactic measures, unspecified: Secondary | ICD-10-CM | POA: Diagnosis not present

## 2022-05-19 DIAGNOSIS — F3342 Major depressive disorder, recurrent, in full remission: Secondary | ICD-10-CM | POA: Diagnosis not present

## 2022-05-19 DIAGNOSIS — F03918 Unspecified dementia, unspecified severity, with other behavioral disturbance: Secondary | ICD-10-CM | POA: Diagnosis not present

## 2022-05-19 DIAGNOSIS — H16223 Keratoconjunctivitis sicca, not specified as Sjogren's, bilateral: Secondary | ICD-10-CM | POA: Diagnosis not present

## 2022-05-19 DIAGNOSIS — R350 Frequency of micturition: Secondary | ICD-10-CM | POA: Diagnosis not present

## 2022-05-30 DIAGNOSIS — I10 Essential (primary) hypertension: Secondary | ICD-10-CM | POA: Diagnosis not present

## 2022-05-30 DIAGNOSIS — Z299 Encounter for prophylactic measures, unspecified: Secondary | ICD-10-CM | POA: Diagnosis not present

## 2022-05-30 DIAGNOSIS — F339 Major depressive disorder, recurrent, unspecified: Secondary | ICD-10-CM | POA: Diagnosis not present

## 2022-05-30 DIAGNOSIS — E1165 Type 2 diabetes mellitus with hyperglycemia: Secondary | ICD-10-CM | POA: Diagnosis not present

## 2022-05-30 DIAGNOSIS — F0390 Unspecified dementia without behavioral disturbance: Secondary | ICD-10-CM | POA: Diagnosis not present

## 2022-06-03 DIAGNOSIS — I10 Essential (primary) hypertension: Secondary | ICD-10-CM | POA: Diagnosis not present

## 2022-06-03 DIAGNOSIS — Z299 Encounter for prophylactic measures, unspecified: Secondary | ICD-10-CM | POA: Diagnosis not present

## 2022-06-03 DIAGNOSIS — F0390 Unspecified dementia without behavioral disturbance: Secondary | ICD-10-CM | POA: Diagnosis not present

## 2022-06-03 DIAGNOSIS — E039 Hypothyroidism, unspecified: Secondary | ICD-10-CM | POA: Diagnosis not present

## 2022-06-13 DIAGNOSIS — Z7902 Long term (current) use of antithrombotics/antiplatelets: Secondary | ICD-10-CM | POA: Diagnosis not present

## 2022-06-13 DIAGNOSIS — I890 Lymphedema, not elsewhere classified: Secondary | ICD-10-CM | POA: Diagnosis not present

## 2022-06-13 DIAGNOSIS — E039 Hypothyroidism, unspecified: Secondary | ICD-10-CM | POA: Diagnosis not present

## 2022-06-13 DIAGNOSIS — Z7982 Long term (current) use of aspirin: Secondary | ICD-10-CM | POA: Diagnosis not present

## 2022-06-13 DIAGNOSIS — K5730 Diverticulosis of large intestine without perforation or abscess without bleeding: Secondary | ICD-10-CM | POA: Diagnosis not present

## 2022-06-13 DIAGNOSIS — R1310 Dysphagia, unspecified: Secondary | ICD-10-CM | POA: Diagnosis not present

## 2022-06-13 DIAGNOSIS — I10 Essential (primary) hypertension: Secondary | ICD-10-CM | POA: Diagnosis not present

## 2022-06-13 DIAGNOSIS — K5900 Constipation, unspecified: Secondary | ICD-10-CM | POA: Diagnosis not present

## 2022-06-13 DIAGNOSIS — Z87440 Personal history of urinary (tract) infections: Secondary | ICD-10-CM | POA: Diagnosis not present

## 2022-06-13 DIAGNOSIS — E559 Vitamin D deficiency, unspecified: Secondary | ICD-10-CM | POA: Diagnosis not present

## 2022-06-13 DIAGNOSIS — D509 Iron deficiency anemia, unspecified: Secondary | ICD-10-CM | POA: Diagnosis not present

## 2022-06-13 DIAGNOSIS — E7800 Pure hypercholesterolemia, unspecified: Secondary | ICD-10-CM | POA: Diagnosis not present

## 2022-06-13 DIAGNOSIS — E1142 Type 2 diabetes mellitus with diabetic polyneuropathy: Secondary | ICD-10-CM | POA: Diagnosis not present

## 2022-06-13 DIAGNOSIS — L600 Ingrowing nail: Secondary | ICD-10-CM | POA: Diagnosis not present

## 2022-06-13 DIAGNOSIS — E538 Deficiency of other specified B group vitamins: Secondary | ICD-10-CM | POA: Diagnosis not present

## 2022-06-13 DIAGNOSIS — Z85828 Personal history of other malignant neoplasm of skin: Secondary | ICD-10-CM | POA: Diagnosis not present

## 2022-06-13 DIAGNOSIS — F0393 Unspecified dementia, unspecified severity, with mood disturbance: Secondary | ICD-10-CM | POA: Diagnosis not present

## 2022-06-13 DIAGNOSIS — E1165 Type 2 diabetes mellitus with hyperglycemia: Secondary | ICD-10-CM | POA: Diagnosis not present

## 2022-06-13 DIAGNOSIS — F419 Anxiety disorder, unspecified: Secondary | ICD-10-CM | POA: Diagnosis not present

## 2022-06-13 DIAGNOSIS — E1151 Type 2 diabetes mellitus with diabetic peripheral angiopathy without gangrene: Secondary | ICD-10-CM | POA: Diagnosis not present

## 2022-06-13 DIAGNOSIS — F3342 Major depressive disorder, recurrent, in full remission: Secondary | ICD-10-CM | POA: Diagnosis not present

## 2022-06-13 DIAGNOSIS — M179 Osteoarthritis of knee, unspecified: Secondary | ICD-10-CM | POA: Diagnosis not present

## 2022-06-13 DIAGNOSIS — K219 Gastro-esophageal reflux disease without esophagitis: Secondary | ICD-10-CM | POA: Diagnosis not present

## 2022-06-13 DIAGNOSIS — M810 Age-related osteoporosis without current pathological fracture: Secondary | ICD-10-CM | POA: Diagnosis not present

## 2022-06-13 DIAGNOSIS — F03918 Unspecified dementia, unspecified severity, with other behavioral disturbance: Secondary | ICD-10-CM | POA: Diagnosis not present

## 2022-07-01 DIAGNOSIS — Z299 Encounter for prophylactic measures, unspecified: Secondary | ICD-10-CM | POA: Diagnosis not present

## 2022-07-01 DIAGNOSIS — I10 Essential (primary) hypertension: Secondary | ICD-10-CM | POA: Diagnosis not present

## 2022-07-01 DIAGNOSIS — F0390 Unspecified dementia without behavioral disturbance: Secondary | ICD-10-CM | POA: Diagnosis not present

## 2022-07-11 DIAGNOSIS — E1165 Type 2 diabetes mellitus with hyperglycemia: Secondary | ICD-10-CM | POA: Diagnosis not present

## 2022-07-11 DIAGNOSIS — F3342 Major depressive disorder, recurrent, in full remission: Secondary | ICD-10-CM | POA: Diagnosis not present

## 2022-07-11 DIAGNOSIS — F03918 Unspecified dementia, unspecified severity, with other behavioral disturbance: Secondary | ICD-10-CM | POA: Diagnosis not present

## 2022-07-11 DIAGNOSIS — I10 Essential (primary) hypertension: Secondary | ICD-10-CM | POA: Diagnosis not present

## 2022-07-11 DIAGNOSIS — I890 Lymphedema, not elsewhere classified: Secondary | ICD-10-CM | POA: Diagnosis not present

## 2022-07-11 DIAGNOSIS — F0393 Unspecified dementia, unspecified severity, with mood disturbance: Secondary | ICD-10-CM | POA: Diagnosis not present

## 2022-07-14 DIAGNOSIS — F3342 Major depressive disorder, recurrent, in full remission: Secondary | ICD-10-CM | POA: Diagnosis not present

## 2022-07-14 DIAGNOSIS — F03911 Unspecified dementia, unspecified severity, with agitation: Secondary | ICD-10-CM | POA: Diagnosis not present

## 2022-07-14 DIAGNOSIS — I10 Essential (primary) hypertension: Secondary | ICD-10-CM | POA: Diagnosis not present

## 2022-07-14 DIAGNOSIS — E1165 Type 2 diabetes mellitus with hyperglycemia: Secondary | ICD-10-CM | POA: Diagnosis not present

## 2022-09-01 DIAGNOSIS — I890 Lymphedema, not elsewhere classified: Secondary | ICD-10-CM | POA: Diagnosis not present

## 2022-09-01 DIAGNOSIS — I10 Essential (primary) hypertension: Secondary | ICD-10-CM | POA: Diagnosis not present

## 2022-09-01 DIAGNOSIS — Z299 Encounter for prophylactic measures, unspecified: Secondary | ICD-10-CM | POA: Diagnosis not present

## 2022-09-01 DIAGNOSIS — F0390 Unspecified dementia without behavioral disturbance: Secondary | ICD-10-CM | POA: Diagnosis not present

## 2022-09-01 DIAGNOSIS — E1165 Type 2 diabetes mellitus with hyperglycemia: Secondary | ICD-10-CM | POA: Diagnosis not present

## 2022-09-01 DIAGNOSIS — D509 Iron deficiency anemia, unspecified: Secondary | ICD-10-CM | POA: Diagnosis not present

## 2022-09-16 ENCOUNTER — Ambulatory Visit: Payer: Self-pay | Admitting: *Deleted

## 2022-09-16 NOTE — Telephone Encounter (Signed)
Summary: Leg issues, vein/vascular   She says to please tell her that "[name] asked me to speak to you" (She doesn't trust anyone she says. She knows to look out for [phone])  ----- Message from [name] sent at 09/16/2022  3:51 PM EDT ----- [phone] Pt called reporting that she has some concerning blood issues. She is having trouble with her current MD Dr. Sherril Croon and does not trust him. She requested Dr. Adele Dan contact information for vein and vascular.      Chief Complaint: requesting a new PCP. C/o skin issues and lymphdema Symptoms: reports sx not getting better after seeing specialists legs swollen skin dry and peeling bumps under skin and itching. Skin in some areas dark / black. Difficulty redirecting patient to answer specific questions.  Frequency: na  Pertinent Negatives: Patient denies bleeding or infection now  Disposition: [] ED /[x] Urgent Care (no appt availability in office) / [] Appointment(In office/virtual)/ []  [facility]/ [] Home Care/ [] Refused Recommended Disposition /[] [facility]/ []  Follow-up with PCP Additional Notes:   Recommended patient to go to UC or ED for worsening sx . Encouraged patient to get DPR son and daughter to assist with finding a new PCP or assist with care. Patient continued to repeat self and unsure if patient understood disposition.         Reason for Disposition  [1] Severe localized itching AND [2] after 2 days of steroid cream  Answer Assessment - Initial Assessment Questions 1. APPEARANCE of RASH: "Describe the rash."      Bilateral legs swelling and skin dry and itching  2. LOCATION: "Where is the rash located?"      Bilateral legs  3. NUMBER: "How many spots are there?"      na 4. SIZE: "How big are the spots?" (Inches, centimeters or compare to size of a coin)      na 5. ONSET: "When did the rash start?"      Na  6. ITCHING: "Does the rash itch?" If Yes, ask: "How bad is the itch?"  (Scale  0-10; or none, mild, moderate, severe)     Itching and reports scratches legs and pulling scabs and starts bleeding  7. PAIN: "Does the rash hurt?" If Yes, ask: "How bad is the pain?"  (Scale 0-10; or none, mild, moderate, severe)    - NONE (0): no pain    - MILD (1-3): doesn't interfere with normal activities     - MODERATE (4-7): interferes with normal activities or awakens from sleep     - SEVERE (8-10): excruciating pain, unable to do any normal activities     Na  8. OTHER SYMPTOMS: "Do you have any other symptoms?" (e.g., fever)     Na  9. PREGNANCY: "Is there any chance you are pregnant?" "When was your last menstrual period?"     Na  Protocols used: Rash or Redness - Localized-A-AH

## 2022-09-23 ENCOUNTER — Other Ambulatory Visit: Payer: Self-pay | Admitting: *Deleted

## 2022-09-23 DIAGNOSIS — M79604 Pain in right leg: Secondary | ICD-10-CM

## 2022-10-17 ENCOUNTER — Ambulatory Visit (HOSPITAL_COMMUNITY): Payer: Medicare Other

## 2022-12-09 DIAGNOSIS — S93409D Sprain of unspecified ligament of unspecified ankle, subsequent encounter: Secondary | ICD-10-CM | POA: Diagnosis not present

## 2022-12-09 DIAGNOSIS — R4182 Altered mental status, unspecified: Secondary | ICD-10-CM | POA: Diagnosis not present

## 2022-12-09 DIAGNOSIS — I69398 Other sequelae of cerebral infarction: Secondary | ICD-10-CM | POA: Diagnosis not present

## 2022-12-09 DIAGNOSIS — I10 Essential (primary) hypertension: Secondary | ICD-10-CM | POA: Diagnosis not present

## 2022-12-09 DIAGNOSIS — S9031XA Contusion of right foot, initial encounter: Secondary | ICD-10-CM | POA: Diagnosis not present

## 2022-12-09 DIAGNOSIS — Z6821 Body mass index (BMI) 21.0-21.9, adult: Secondary | ICD-10-CM | POA: Diagnosis not present

## 2022-12-09 DIAGNOSIS — S93402D Sprain of unspecified ligament of left ankle, subsequent encounter: Secondary | ICD-10-CM | POA: Diagnosis not present

## 2022-12-09 DIAGNOSIS — Z7902 Long term (current) use of antithrombotics/antiplatelets: Secondary | ICD-10-CM | POA: Diagnosis not present

## 2022-12-09 DIAGNOSIS — E785 Hyperlipidemia, unspecified: Secondary | ICD-10-CM | POA: Diagnosis not present

## 2022-12-09 DIAGNOSIS — F32A Depression, unspecified: Secondary | ICD-10-CM | POA: Diagnosis not present

## 2022-12-09 DIAGNOSIS — S93401D Sprain of unspecified ligament of right ankle, subsequent encounter: Secondary | ICD-10-CM | POA: Diagnosis not present

## 2022-12-09 DIAGNOSIS — M2011 Hallux valgus (acquired), right foot: Secondary | ICD-10-CM | POA: Diagnosis not present

## 2022-12-09 DIAGNOSIS — E440 Moderate protein-calorie malnutrition: Secondary | ICD-10-CM | POA: Diagnosis not present

## 2022-12-09 DIAGNOSIS — E7800 Pure hypercholesterolemia, unspecified: Secondary | ICD-10-CM | POA: Diagnosis not present

## 2022-12-09 DIAGNOSIS — Z882 Allergy status to sulfonamides status: Secondary | ICD-10-CM | POA: Diagnosis not present

## 2022-12-09 DIAGNOSIS — Z91030 Bee allergy status: Secondary | ICD-10-CM | POA: Diagnosis not present

## 2022-12-09 DIAGNOSIS — M25571 Pain in right ankle and joints of right foot: Secondary | ICD-10-CM | POA: Diagnosis not present

## 2022-12-09 DIAGNOSIS — Z880 Allergy status to penicillin: Secondary | ICD-10-CM | POA: Diagnosis not present

## 2022-12-09 DIAGNOSIS — Z8673 Personal history of transient ischemic attack (TIA), and cerebral infarction without residual deficits: Secondary | ICD-10-CM | POA: Diagnosis not present

## 2022-12-09 DIAGNOSIS — M810 Age-related osteoporosis without current pathological fracture: Secondary | ICD-10-CM | POA: Diagnosis not present

## 2022-12-09 DIAGNOSIS — R509 Fever, unspecified: Secondary | ICD-10-CM | POA: Diagnosis not present

## 2022-12-09 DIAGNOSIS — Z602 Problems related to living alone: Secondary | ICD-10-CM | POA: Diagnosis not present

## 2022-12-09 DIAGNOSIS — M7989 Other specified soft tissue disorders: Secondary | ICD-10-CM | POA: Diagnosis not present

## 2022-12-09 DIAGNOSIS — W19XXXA Unspecified fall, initial encounter: Secondary | ICD-10-CM | POA: Diagnosis not present

## 2022-12-09 DIAGNOSIS — I6782 Cerebral ischemia: Secondary | ICD-10-CM | POA: Diagnosis not present

## 2022-12-09 DIAGNOSIS — R609 Edema, unspecified: Secondary | ICD-10-CM | POA: Diagnosis not present

## 2022-12-09 DIAGNOSIS — F0390 Unspecified dementia without behavioral disturbance: Secondary | ICD-10-CM | POA: Diagnosis not present

## 2022-12-09 DIAGNOSIS — M19071 Primary osteoarthritis, right ankle and foot: Secondary | ICD-10-CM | POA: Diagnosis not present

## 2022-12-09 DIAGNOSIS — E039 Hypothyroidism, unspecified: Secondary | ICD-10-CM | POA: Diagnosis not present

## 2022-12-09 DIAGNOSIS — Z79890 Hormone replacement therapy (postmenopausal): Secondary | ICD-10-CM | POA: Diagnosis not present

## 2022-12-09 DIAGNOSIS — R410 Disorientation, unspecified: Secondary | ICD-10-CM | POA: Diagnosis not present

## 2022-12-09 DIAGNOSIS — Z792 Long term (current) use of antibiotics: Secondary | ICD-10-CM | POA: Diagnosis not present

## 2022-12-09 DIAGNOSIS — S93401A Sprain of unspecified ligament of right ankle, initial encounter: Secondary | ICD-10-CM | POA: Diagnosis not present

## 2022-12-09 DIAGNOSIS — Z79899 Other long term (current) drug therapy: Secondary | ICD-10-CM | POA: Diagnosis not present

## 2022-12-09 DIAGNOSIS — F0392 Unspecified dementia, unspecified severity, with psychotic disturbance: Secondary | ICD-10-CM | POA: Diagnosis not present

## 2022-12-09 DIAGNOSIS — W19XXXD Unspecified fall, subsequent encounter: Secondary | ICD-10-CM | POA: Diagnosis not present

## 2022-12-09 DIAGNOSIS — S93409A Sprain of unspecified ligament of unspecified ankle, initial encounter: Secondary | ICD-10-CM | POA: Diagnosis not present

## 2022-12-09 DIAGNOSIS — E119 Type 2 diabetes mellitus without complications: Secondary | ICD-10-CM | POA: Diagnosis not present

## 2022-12-09 DIAGNOSIS — R54 Age-related physical debility: Secondary | ICD-10-CM | POA: Diagnosis not present

## 2022-12-09 DIAGNOSIS — M79671 Pain in right foot: Secondary | ICD-10-CM | POA: Diagnosis not present

## 2022-12-09 DIAGNOSIS — S99911A Unspecified injury of right ankle, initial encounter: Secondary | ICD-10-CM | POA: Diagnosis not present

## 2022-12-09 DIAGNOSIS — R443 Hallucinations, unspecified: Secondary | ICD-10-CM | POA: Diagnosis not present

## 2022-12-12 DIAGNOSIS — R269 Unspecified abnormalities of gait and mobility: Secondary | ICD-10-CM | POA: Diagnosis not present

## 2022-12-12 DIAGNOSIS — E785 Hyperlipidemia, unspecified: Secondary | ICD-10-CM | POA: Diagnosis not present

## 2022-12-12 DIAGNOSIS — S93409D Sprain of unspecified ligament of unspecified ankle, subsequent encounter: Secondary | ICD-10-CM | POA: Diagnosis not present

## 2022-12-12 DIAGNOSIS — S93409A Sprain of unspecified ligament of unspecified ankle, initial encounter: Secondary | ICD-10-CM | POA: Diagnosis not present

## 2022-12-12 DIAGNOSIS — E039 Hypothyroidism, unspecified: Secondary | ICD-10-CM | POA: Diagnosis not present

## 2022-12-12 DIAGNOSIS — R54 Age-related physical debility: Secondary | ICD-10-CM | POA: Diagnosis not present

## 2022-12-12 DIAGNOSIS — I10 Essential (primary) hypertension: Secondary | ICD-10-CM | POA: Diagnosis not present

## 2022-12-12 DIAGNOSIS — F0390 Unspecified dementia without behavioral disturbance: Secondary | ICD-10-CM | POA: Diagnosis not present

## 2022-12-12 DIAGNOSIS — M810 Age-related osteoporosis without current pathological fracture: Secondary | ICD-10-CM | POA: Diagnosis not present

## 2022-12-12 DIAGNOSIS — F39 Unspecified mood [affective] disorder: Secondary | ICD-10-CM | POA: Diagnosis not present

## 2022-12-12 DIAGNOSIS — F03B0 Unspecified dementia, moderate, without behavioral disturbance, psychotic disturbance, mood disturbance, and anxiety: Secondary | ICD-10-CM | POA: Diagnosis not present

## 2022-12-12 DIAGNOSIS — E440 Moderate protein-calorie malnutrition: Secondary | ICD-10-CM | POA: Diagnosis not present

## 2022-12-12 DIAGNOSIS — S93402D Sprain of unspecified ligament of left ankle, subsequent encounter: Secondary | ICD-10-CM | POA: Diagnosis not present

## 2022-12-12 DIAGNOSIS — Z8673 Personal history of transient ischemic attack (TIA), and cerebral infarction without residual deficits: Secondary | ICD-10-CM | POA: Diagnosis not present

## 2022-12-12 DIAGNOSIS — F413 Other mixed anxiety disorders: Secondary | ICD-10-CM | POA: Diagnosis not present

## 2022-12-12 DIAGNOSIS — S93401D Sprain of unspecified ligament of right ankle, subsequent encounter: Secondary | ICD-10-CM | POA: Diagnosis not present

## 2022-12-12 DIAGNOSIS — E119 Type 2 diabetes mellitus without complications: Secondary | ICD-10-CM | POA: Diagnosis not present

## 2022-12-12 DIAGNOSIS — W19XXXD Unspecified fall, subsequent encounter: Secondary | ICD-10-CM | POA: Diagnosis not present

## 2022-12-12 DIAGNOSIS — F32A Depression, unspecified: Secondary | ICD-10-CM | POA: Diagnosis not present

## 2022-12-17 DIAGNOSIS — F39 Unspecified mood [affective] disorder: Secondary | ICD-10-CM | POA: Diagnosis not present

## 2022-12-17 DIAGNOSIS — F03B0 Unspecified dementia, moderate, without behavioral disturbance, psychotic disturbance, mood disturbance, and anxiety: Secondary | ICD-10-CM | POA: Diagnosis not present

## 2022-12-17 DIAGNOSIS — F413 Other mixed anxiety disorders: Secondary | ICD-10-CM | POA: Diagnosis not present

## 2022-12-21 DIAGNOSIS — E039 Hypothyroidism, unspecified: Secondary | ICD-10-CM | POA: Diagnosis not present

## 2022-12-22 ENCOUNTER — Encounter (HOSPITAL_COMMUNITY): Payer: Medicare Other

## 2022-12-25 DIAGNOSIS — S93402D Sprain of unspecified ligament of left ankle, subsequent encounter: Secondary | ICD-10-CM | POA: Diagnosis not present

## 2022-12-31 DIAGNOSIS — F413 Other mixed anxiety disorders: Secondary | ICD-10-CM | POA: Diagnosis not present

## 2022-12-31 DIAGNOSIS — F39 Unspecified mood [affective] disorder: Secondary | ICD-10-CM | POA: Diagnosis not present

## 2022-12-31 DIAGNOSIS — F03B0 Unspecified dementia, moderate, without behavioral disturbance, psychotic disturbance, mood disturbance, and anxiety: Secondary | ICD-10-CM | POA: Diagnosis not present

## 2023-01-14 DIAGNOSIS — F03B0 Unspecified dementia, moderate, without behavioral disturbance, psychotic disturbance, mood disturbance, and anxiety: Secondary | ICD-10-CM | POA: Diagnosis not present

## 2023-01-14 DIAGNOSIS — F413 Other mixed anxiety disorders: Secondary | ICD-10-CM | POA: Diagnosis not present

## 2023-01-14 DIAGNOSIS — F39 Unspecified mood [affective] disorder: Secondary | ICD-10-CM | POA: Diagnosis not present

## 2023-01-14 DIAGNOSIS — R269 Unspecified abnormalities of gait and mobility: Secondary | ICD-10-CM | POA: Diagnosis not present

## 2023-02-10 DIAGNOSIS — E1165 Type 2 diabetes mellitus with hyperglycemia: Secondary | ICD-10-CM | POA: Diagnosis not present

## 2023-02-10 DIAGNOSIS — R600 Localized edema: Secondary | ICD-10-CM | POA: Diagnosis not present

## 2023-02-10 DIAGNOSIS — Z299 Encounter for prophylactic measures, unspecified: Secondary | ICD-10-CM | POA: Diagnosis not present

## 2023-02-10 DIAGNOSIS — I10 Essential (primary) hypertension: Secondary | ICD-10-CM | POA: Diagnosis not present

## 2023-02-10 DIAGNOSIS — F0390 Unspecified dementia without behavioral disturbance: Secondary | ICD-10-CM | POA: Diagnosis not present

## 2023-03-09 DIAGNOSIS — M810 Age-related osteoporosis without current pathological fracture: Secondary | ICD-10-CM | POA: Diagnosis not present

## 2023-03-09 DIAGNOSIS — I10 Essential (primary) hypertension: Secondary | ICD-10-CM | POA: Diagnosis not present

## 2023-03-09 DIAGNOSIS — Z1331 Encounter for screening for depression: Secondary | ICD-10-CM | POA: Diagnosis not present

## 2023-03-09 DIAGNOSIS — Z79899 Other long term (current) drug therapy: Secondary | ICD-10-CM | POA: Diagnosis not present

## 2023-03-09 DIAGNOSIS — Z7189 Other specified counseling: Secondary | ICD-10-CM | POA: Diagnosis not present

## 2023-03-09 DIAGNOSIS — Z0000 Encounter for general adult medical examination without abnormal findings: Secondary | ICD-10-CM | POA: Diagnosis not present

## 2023-03-09 DIAGNOSIS — Z299 Encounter for prophylactic measures, unspecified: Secondary | ICD-10-CM | POA: Diagnosis not present

## 2023-03-09 DIAGNOSIS — E7800 Pure hypercholesterolemia, unspecified: Secondary | ICD-10-CM | POA: Diagnosis not present

## 2023-03-09 DIAGNOSIS — E039 Hypothyroidism, unspecified: Secondary | ICD-10-CM | POA: Diagnosis not present

## 2023-03-09 DIAGNOSIS — R5383 Other fatigue: Secondary | ICD-10-CM | POA: Diagnosis not present

## 2023-03-09 DIAGNOSIS — Z1339 Encounter for screening examination for other mental health and behavioral disorders: Secondary | ICD-10-CM | POA: Diagnosis not present

## 2023-05-12 DIAGNOSIS — N3281 Overactive bladder: Secondary | ICD-10-CM | POA: Diagnosis not present

## 2023-05-12 DIAGNOSIS — I10 Essential (primary) hypertension: Secondary | ICD-10-CM | POA: Diagnosis not present

## 2023-05-12 DIAGNOSIS — F03918 Unspecified dementia, unspecified severity, with other behavioral disturbance: Secondary | ICD-10-CM | POA: Diagnosis not present

## 2023-05-12 DIAGNOSIS — Z299 Encounter for prophylactic measures, unspecified: Secondary | ICD-10-CM | POA: Diagnosis not present

## 2023-05-12 DIAGNOSIS — E1169 Type 2 diabetes mellitus with other specified complication: Secondary | ICD-10-CM | POA: Diagnosis not present

## 2023-05-19 DIAGNOSIS — I10 Essential (primary) hypertension: Secondary | ICD-10-CM | POA: Diagnosis not present

## 2023-05-19 DIAGNOSIS — N390 Urinary tract infection, site not specified: Secondary | ICD-10-CM | POA: Diagnosis not present

## 2023-05-19 DIAGNOSIS — R350 Frequency of micturition: Secondary | ICD-10-CM | POA: Diagnosis not present

## 2023-05-19 DIAGNOSIS — Z299 Encounter for prophylactic measures, unspecified: Secondary | ICD-10-CM | POA: Diagnosis not present

## 2023-05-19 DIAGNOSIS — F03918 Unspecified dementia, unspecified severity, with other behavioral disturbance: Secondary | ICD-10-CM | POA: Diagnosis not present

## 2023-05-19 DIAGNOSIS — F0390 Unspecified dementia without behavioral disturbance: Secondary | ICD-10-CM | POA: Diagnosis not present

## 2023-05-21 DIAGNOSIS — R350 Frequency of micturition: Secondary | ICD-10-CM | POA: Diagnosis not present

## 2023-06-15 DIAGNOSIS — E2839 Other primary ovarian failure: Secondary | ICD-10-CM | POA: Diagnosis not present

## 2023-06-15 DIAGNOSIS — E039 Hypothyroidism, unspecified: Secondary | ICD-10-CM | POA: Diagnosis not present

## 2023-06-15 DIAGNOSIS — I739 Peripheral vascular disease, unspecified: Secondary | ICD-10-CM | POA: Diagnosis not present

## 2023-06-15 DIAGNOSIS — Z7189 Other specified counseling: Secondary | ICD-10-CM | POA: Diagnosis not present

## 2023-06-15 DIAGNOSIS — Z1339 Encounter for screening examination for other mental health and behavioral disorders: Secondary | ICD-10-CM | POA: Diagnosis not present

## 2023-06-15 DIAGNOSIS — F3342 Major depressive disorder, recurrent, in full remission: Secondary | ICD-10-CM | POA: Diagnosis not present

## 2023-06-15 DIAGNOSIS — Z1331 Encounter for screening for depression: Secondary | ICD-10-CM | POA: Diagnosis not present

## 2023-06-15 DIAGNOSIS — Z0000 Encounter for general adult medical examination without abnormal findings: Secondary | ICD-10-CM | POA: Diagnosis not present

## 2023-06-15 DIAGNOSIS — I10 Essential (primary) hypertension: Secondary | ICD-10-CM | POA: Diagnosis not present

## 2023-06-15 DIAGNOSIS — Z299 Encounter for prophylactic measures, unspecified: Secondary | ICD-10-CM | POA: Diagnosis not present

## 2023-07-27 DIAGNOSIS — I872 Venous insufficiency (chronic) (peripheral): Secondary | ICD-10-CM | POA: Diagnosis not present

## 2023-09-21 DIAGNOSIS — E1165 Type 2 diabetes mellitus with hyperglycemia: Secondary | ICD-10-CM | POA: Diagnosis not present

## 2023-09-21 DIAGNOSIS — F03918 Unspecified dementia, unspecified severity, with other behavioral disturbance: Secondary | ICD-10-CM | POA: Diagnosis not present

## 2023-09-21 DIAGNOSIS — I10 Essential (primary) hypertension: Secondary | ICD-10-CM | POA: Diagnosis not present

## 2023-09-21 DIAGNOSIS — Z299 Encounter for prophylactic measures, unspecified: Secondary | ICD-10-CM | POA: Diagnosis not present

## 2023-09-23 DIAGNOSIS — R350 Frequency of micturition: Secondary | ICD-10-CM | POA: Diagnosis not present

## 2023-12-30 DIAGNOSIS — Z299 Encounter for prophylactic measures, unspecified: Secondary | ICD-10-CM | POA: Diagnosis not present

## 2023-12-30 DIAGNOSIS — Z6829 Body mass index (BMI) 29.0-29.9, adult: Secondary | ICD-10-CM | POA: Diagnosis not present

## 2023-12-30 DIAGNOSIS — R5383 Other fatigue: Secondary | ICD-10-CM | POA: Diagnosis not present

## 2023-12-30 DIAGNOSIS — Z23 Encounter for immunization: Secondary | ICD-10-CM | POA: Diagnosis not present

## 2023-12-30 DIAGNOSIS — E119 Type 2 diabetes mellitus without complications: Secondary | ICD-10-CM | POA: Diagnosis not present

## 2023-12-30 DIAGNOSIS — I10 Essential (primary) hypertension: Secondary | ICD-10-CM | POA: Diagnosis not present

## 2023-12-31 DIAGNOSIS — R350 Frequency of micturition: Secondary | ICD-10-CM | POA: Diagnosis not present

## 2024-02-02 DIAGNOSIS — B351 Tinea unguium: Secondary | ICD-10-CM | POA: Diagnosis not present

## 2024-02-02 DIAGNOSIS — M79671 Pain in right foot: Secondary | ICD-10-CM | POA: Diagnosis not present

## 2024-02-02 DIAGNOSIS — M79672 Pain in left foot: Secondary | ICD-10-CM | POA: Diagnosis not present

## 2024-02-02 DIAGNOSIS — M79675 Pain in left toe(s): Secondary | ICD-10-CM | POA: Diagnosis not present

## 2024-02-02 DIAGNOSIS — I739 Peripheral vascular disease, unspecified: Secondary | ICD-10-CM | POA: Diagnosis not present

## 2024-02-02 DIAGNOSIS — M79674 Pain in right toe(s): Secondary | ICD-10-CM | POA: Diagnosis not present

## 2024-02-24 DIAGNOSIS — R5383 Other fatigue: Secondary | ICD-10-CM | POA: Diagnosis not present

## 2024-02-24 DIAGNOSIS — M810 Age-related osteoporosis without current pathological fracture: Secondary | ICD-10-CM | POA: Diagnosis not present
